# Patient Record
Sex: Male | Born: 1970 | Race: White | Hispanic: No | Marital: Married | Smoking: Never smoker
Health system: Southern US, Community
[De-identification: ages and names within clinical notes are randomized; demographics above are authoritative.]

## PROBLEM LIST (undated history)

## (undated) DIAGNOSIS — M1732 Unilateral post-traumatic osteoarthritis, left knee: Secondary | ICD-10-CM

## (undated) DIAGNOSIS — M6740 Ganglion, unspecified site: Secondary | ICD-10-CM

## (undated) DIAGNOSIS — T8859XA Other complications of anesthesia, initial encounter: Secondary | ICD-10-CM

## (undated) DIAGNOSIS — F909 Attention-deficit hyperactivity disorder, unspecified type: Secondary | ICD-10-CM

## (undated) DIAGNOSIS — E785 Hyperlipidemia, unspecified: Secondary | ICD-10-CM

## (undated) HISTORY — PX: KNEE ARTHROSCOPY: SUR90

## (undated) HISTORY — PX: COLONOSCOPY: SHX174

## (undated) HISTORY — PX: MOHS SURGERY: SHX181

---

## 2012-02-25 DIAGNOSIS — K859 Acute pancreatitis without necrosis or infection, unspecified: Secondary | ICD-10-CM

## 2012-02-25 HISTORY — DX: Acute pancreatitis without necrosis or infection, unspecified: K85.90

## 2019-03-20 ENCOUNTER — Emergency Department
Admission: EM | Admit: 2019-03-20 | Discharge: 2019-03-20 | Disposition: A | Discharge status: Home / Self Care | Payer: BC Managed Care – PPO | Attending: Emergency Medicine | Admitting: Emergency Medicine

## 2019-03-20 ENCOUNTER — Emergency Department: Payer: BC Managed Care – PPO

## 2019-03-20 DIAGNOSIS — R519 Headache, unspecified: Secondary | ICD-10-CM | POA: Insufficient documentation

## 2019-03-20 DIAGNOSIS — R200 Anesthesia of skin: Secondary | ICD-10-CM | POA: Diagnosis present

## 2019-03-20 DIAGNOSIS — R202 Paresthesia of skin: Secondary | ICD-10-CM | POA: Diagnosis not present

## 2019-03-20 LAB — CBC WITH DIFFERENTIAL/PLATELET
Abs Immature Granulocytes: 0.01 10*3/uL (ref 0.00–0.07)
Basophils Absolute: 0 10*3/uL (ref 0.0–0.1)
Basophils Relative: 1 %
Eosinophils Absolute: 0.1 10*3/uL (ref 0.0–0.5)
Eosinophils Relative: 2 %
HCT: 48.5 % (ref 39.0–52.0)
Hemoglobin: 16.8 g/dL (ref 13.0–17.0)
Immature Granulocytes: 0 %
Lymphocytes Relative: 23 %
Lymphs Abs: 1 10*3/uL (ref 0.7–4.0)
MCH: 29.5 pg (ref 26.0–34.0)
MCHC: 34.6 g/dL (ref 30.0–36.0)
MCV: 85.1 fL (ref 80.0–100.0)
Monocytes Absolute: 0.4 10*3/uL (ref 0.1–1.0)
Monocytes Relative: 10 %
Neutro Abs: 2.8 10*3/uL (ref 1.7–7.7)
Neutrophils Relative %: 64 %
Platelets: 137 10*3/uL — ABNORMAL LOW (ref 150–400)
RBC: 5.7 MIL/uL (ref 4.22–5.81)
RDW: 13.1 % (ref 11.5–15.5)
WBC: 4.3 10*3/uL (ref 4.0–10.5)
nRBC: 0 % (ref 0.0–0.2)

## 2019-03-20 LAB — BASIC METABOLIC PANEL
Anion gap: 8 (ref 5–15)
BUN: 16 mg/dL (ref 6–20)
CO2: 28 mmol/L (ref 22–32)
Calcium: 9.5 mg/dL (ref 8.9–10.3)
Chloride: 104 mmol/L (ref 98–111)
Creatinine, Ser: 1.02 mg/dL (ref 0.61–1.24)
GFR calc Af Amer: >60 mL/min (ref >60)
GFR calc non Af Amer: >60 mL/min (ref >60)
Glucose, Bld: 97 mg/dL (ref 70–99)
Potassium: 4.5 mmol/L (ref 3.5–5.1)
Sodium: 140 mmol/L (ref 135–145)

## 2019-03-20 MED ORDER — SODIUM CHLORIDE 0.9 % IV BOLUS
1000.0000 mL | Freq: Once | INTRAVENOUS | Status: AC
  Administered 2019-03-20: 1000 mL via INTRAVENOUS

## 2019-03-20 MED ORDER — METOCLOPRAMIDE HCL 5 MG/ML IJ SOLN
10.0000 mg | Freq: Once | INTRAMUSCULAR | Status: AC
  Administered 2019-03-20: 10 mg via INTRAVENOUS
  Filled 2019-03-20: qty 2

## 2019-03-20 MED ORDER — DIPHENHYDRAMINE HCL 50 MG/ML IJ SOLN
12.5000 mg | Freq: Once | INTRAMUSCULAR | Status: AC
  Administered 2019-03-20: 12.5 mg via INTRAVENOUS
  Filled 2019-03-20: qty 1

## 2019-03-20 MED ORDER — ACETAMINOPHEN 500 MG PO TABS
1000.0000 mg | ORAL_TABLET | Freq: Once | ORAL | Status: AC
  Administered 2019-03-20: 1000 mg via ORAL
  Filled 2019-03-20: qty 2

## 2019-03-20 NOTE — Discharge Instructions (Signed)
Your CT and MRI are negative.  You can follow-up with your primary care doctor.  If you develop facial droop you may need to be started on some steroids for Bell's palsy.  Return to the ER for any other concerns.

## 2019-03-20 NOTE — ED Provider Notes (Signed)
[facility] Emergency Department Provider Note  ____________________________________________   First MD Initiated Contact with Patient 03/20/19 1054     (approximate)  I have reviewed the triage vital signs and the nursing notes.   HISTORY  Chief Complaint Numbness    HPI Jacob Austin is a 49 y.o. male otherwise healthy who comes in for headache and numbness.  Patient states that he has had a 4-day history of headaches.  Was not severe in onset or worst headache of his life.  He states it is just been a mild constant headache for the past few days, originally in the front of his head but kind of wrapping around to the back of his head.  Has been taking over-the-counter's without relief, nothing seems to make it worse.  He states however yesterday he had an episode of right sided face numbness that went away after about 30 minutes.  He went to bed and felt normal around 11 PM however when he woke up around 8 AM he noted to have the numbness on the right cheek and right side of his tongue.  He states that this has been constant since then.  Denies having this previously.  He states that it feels like he has had a procedure on his tooth and the use some numbing medication on it.  He denies any tooth pain.  Denies any difficulty swallowing, weakness, changes in his face, changes in his speech.  He did have an episode of double vision and vertigo 1 week ago that lasted about 30 minutes.  His family member did die from a glioblastoma so he was worried that he could have that.      Medical: None There are no problems to display for this patient.   Prior to Admission medications   Not on File    Allergies Patient has no allergy information on record.  No family history on file.  Social History Denies daily drinking or drugs   Review of Systems Constitutional: No fever/chills Eyes: No visual changes. ENT: No sore throat. Cardiovascular: Denies chest  pain. Respiratory: Denies shortness of breath. Gastrointestinal: No abdominal pain.  No nausea, no vomiting.  No diarrhea.  No constipation. Genitourinary: Negative for dysuria. Musculoskeletal: Negative for back pain. Skin: Negative for rash. Neurological: Positive headache and sensation changes on the right side of his face All other ROS negative ____________________________________________   PHYSICAL EXAM:  VITAL SIGNS: ED Triage Vitals  Enc Vitals Group     BP 03/20/19 1047 (!) 136/91     Pulse Rate 03/20/19 1047 80     Resp 03/20/19 1047 16     Temp 03/20/19 1047 98.8 F (37.1 C)     Temp Source 03/20/19 1047 Oral     SpO2 03/20/19 1047 97 %     Weight 03/20/19 1048 165 lb (74.8 kg)     Height 03/20/19 1048 5\' 5"  (1.651 m)     Head Circumference --      Peak Flow --      Pain Score --      Pain Loc --      Pain Edu? --      Excl. in [place]? --     Constitutional: Alert and oriented. Well appearing and in no acute distress. Eyes: Conjunctivae are normal. EOMI. Head: Atraumatic. Nose: No congestion/rhinnorhea. Mouth/Throat: Mucous membranes are moist.   Neck: No stridor. Trachea Midline. FROM Cardiovascular: Normal rate, regular rhythm. Grossly normal heart sounds.  Good  peripheral circulation. Respiratory: Normal respiratory effort.  No retractions. Lungs CTAB. Gastrointestinal: Soft and nontender. No distention. No abdominal bruits.  Musculoskeletal: No lower extremity tenderness nor edema.  No joint effusions. Neurologic: Cranial nerves II through XII are intact.  Equal strength in arms and legs.  No pronator drift.  Finger-to-nose intact.  Some change in sensation on the right cheek and right side of his tongue.  Maybe some very mild facial asymmetry on the right side. Skin:  Skin is warm, dry and intact. No rash noted. Psychiatric: Mood and affect are normal. Speech and behavior are normal. GU: Deferred   ____________________________________________   LABS (all  labs ordered are listed, but only abnormal results are displayed)  Labs Reviewed  CBC WITH DIFFERENTIAL/PLATELET - Abnormal; Notable for the following components:      Result Value   Platelets 137 (*)    All other components within normal limits  BASIC METABOLIC PANEL   ____________________________________________   ED ECG REPORT I, Vanessa [place], the attending physician, personally viewed and interpreted this ECG.  EKG is normal sinus rate of 74, no st elevation, no T wave inversions, normal intervals ____________________________________________  RADIOLOGY   Official radiology report(s): CT Head Wo Contrast  Result Date: 03/20/2019 CLINICAL DATA:  Sudden onset left-sided facial numbness and tingling since yesterday EXAM: CT HEAD WITHOUT CONTRAST TECHNIQUE: Contiguous axial images were obtained from the base of the skull through the vertex without intravenous contrast. COMPARISON:  None. FINDINGS: Brain: No evidence of acute infarction, hemorrhage, hydrocephalus, extra-axial collection or mass lesion/mass effect. Vascular: No hyperdense vessel or unexpected calcification. Skull: No osseous abnormality. Sinuses/Orbits: Visualized paranasal sinuses are clear. Visualized mastoid sinuses are clear. Visualized orbits demonstrate no focal abnormality. Other: None IMPRESSION: No acute intracranial pathology. Electronically Signed   By: Kathreen Devoid   On: 03/20/2019 12:35   MR BRAIN WO CONTRAST  Result Date: 03/20/2019 CLINICAL DATA:  Neuro deficit, acute, stroke suspected. Additional history provided: Right-sided face numbness. EXAM: MRI HEAD WITHOUT CONTRAST TECHNIQUE: Multiplanar, multiecho pulse sequences of the brain and surrounding structures were obtained without intravenous contrast. COMPARISON:  Head CT 03/20/2019 FINDINGS: Brain: There is no evidence of acute infarct. No evidence of intracranial mass. No midline shift or extra-axial fluid collection. No chronic intracranial blood  products. No focal parenchymal signal abnormality is identified. Cerebral volume is normal for age. Vascular: Flow voids maintained within the proximal large arterial vessels. Skull and upper cervical spine: No focal marrow lesion Sinuses/Orbits: Visualized orbits demonstrate no acute abnormality. Mild mucosal thickening within the bilateral ethmoid and left greater than right maxillary sinuses. Trace fluid within left mastoid air cells. IMPRESSION: Normal MRI appearance of the brain. No evidence of acute intracranial abnormality. Mild paranasal sinus mucosal thickening, greatest within the left maxillary sinus. Electronically Signed   By: Kellie Simmering DO   On: 03/20/2019 14:38    ____________________________________________   PROCEDURES  Procedure(s) performed (including Critical Care):  Procedures   ____________________________________________   INITIAL IMPRESSION / ASSESSMENT AND PLAN / ED COURSE  Radu Misiaszek was evaluated in Emergency Department on 03/20/2019 for the symptoms described in the history of present illness. He was evaluated in the context of the global COVID-19 pandemic, which necessitated consideration that the patient might be at risk for infection with the SARS-CoV-2 virus that causes COVID-19. Institutional protocols and algorithms that pertain to the evaluation of patients at risk for COVID-19 are in a state of rapid change based on information released by regulatory bodies  including the CDC and federal and state organizations. These policies and algorithms were followed during the patient's care in the ED.    Patient presents with some right-sided face tingling and tongue tingling.  NIH stroke scale 1-2.  He has maybe some very mild asymmetry with some flattening on the right side although he states that he has a history of a moles surgery on the left side and he thinks that that could be the cause of the asymmetry.He doesn't notice any chances to face.  He is out of  the window for TPA given last known normal was 1/23 at 11 PM.  Will proceed with CT head to evaluate for intracranial hemorrhage, tumor.  Will consider MRI if CT is negative.  Also treat patient's headache with migraine cocktail to see if it is a complex headache that could be contributing to his symptoms.  Will get labs to evaluate for electrolyte abnormalities.  No obvious facial droop to suggest Bell's palsy.  CT head was negative.  Discussed with patient and will get MRI.  MRI was negative for acute stroke.  Reevaluated patient after the migraine cocktail and his tingling has resolved.  Maybe a little bit on the tongue still but mostly resolved.  We discussed that if he develops facial droop that we could do some steroids for Bell's palsy.  He can follow that up with his primary care doctor.  He does also now report history of trigeminal neuralgia so I stated that if he starts having the shooting electrical pain again that he could follow-up with his primary care doctor for treatment for that as well.  Patient feels medical going home knowing the MRI was negative and will follow up with his primary care doctor  I discussed the provisional nature of ED diagnosis, the treatment so far, the ongoing plan of care, follow up appointments and return precautions with the patient and any family or support people present. They expressed understanding and agreed with the plan, discharged home.   ____________________________________________   FINAL CLINICAL IMPRESSION(S) / ED DIAGNOSES   Final diagnoses:  Tingling      MEDICATIONS GIVEN DURING THIS VISIT:  Medications  acetaminophen (TYLENOL) tablet 1,000 mg (1,000 mg Oral Given 03/20/19 1142)  metoCLOPramide (REGLAN) injection 10 mg (10 mg Intravenous Given 03/20/19 1143)  diphenhydrAMINE (BENADRYL) injection 12.5 mg (12.5 mg Intravenous Given 03/20/19 1143)  sodium chloride 0.9 % bolus 1,000 mL (0 mLs Intravenous Stopped 03/20/19 1245)     ED  Discharge Orders    None       Note:  This document was prepared using Dragon voice recognition software and may include unintentional dictation errors.   Vanessa [place], MD 03/20/19 [phone]

## 2019-03-20 NOTE — ED Triage Notes (Signed)
Pt states he has had a nagging HA for the past 6 days and today woke up with numbness to the right side of face and tongue. Pt is  Ambulatory with a steady gait. Speech is clear.

## 2019-04-12 DIAGNOSIS — C4492 Squamous cell carcinoma of skin, unspecified: Secondary | ICD-10-CM | POA: Insufficient documentation

## 2019-04-12 DIAGNOSIS — E785 Hyperlipidemia, unspecified: Secondary | ICD-10-CM | POA: Insufficient documentation

## 2019-04-12 DIAGNOSIS — G44009 Cluster headache syndrome, unspecified, not intractable: Secondary | ICD-10-CM | POA: Insufficient documentation

## 2021-02-11 DIAGNOSIS — Z969 Presence of functional implant, unspecified: Secondary | ICD-10-CM | POA: Insufficient documentation

## 2021-02-20 ENCOUNTER — Other Ambulatory Visit: Payer: Self-pay | Admitting: Surgery

## 2021-02-21 ENCOUNTER — Other Ambulatory Visit: Payer: Self-pay

## 2021-02-21 ENCOUNTER — Other Ambulatory Visit
Admission: RE | Admit: 2021-02-21 | Discharge: 2021-02-21 | Disposition: A | Discharge status: Home / Self Care | Payer: BC Managed Care – PPO | Source: Ambulatory Visit | Attending: Surgery | Admitting: Surgery

## 2021-02-21 HISTORY — DX: Other complications of anesthesia, initial encounter: T88.59XA

## 2021-02-21 HISTORY — DX: Attention-deficit hyperactivity disorder, unspecified type: F90.9

## 2021-02-21 HISTORY — DX: Ganglion, unspecified site: M67.40

## 2021-02-21 HISTORY — DX: Unilateral post-traumatic osteoarthritis, left knee: M17.32

## 2021-02-21 HISTORY — DX: Hyperlipidemia, unspecified: E78.5

## 2021-02-21 NOTE — Patient Instructions (Addendum)
Your procedure is scheduled on: 02/27/21 - Wednesday Report to the Registration Desk on the 1st floor of the [facility]. To find out your arrival time, please call [phone] between 1PM - 3PM on: 02/26/21 - Tuesday  REMEMBER: Instructions that are not followed completely may result in serious medical risk, up to and including death; or upon the discretion of your surgeon and anesthesiologist your surgery may need to be rescheduled.  Do not eat food after midnight the night before surgery.  No gum chewing, lozengers or hard candies.  You may however, drink CLEAR liquids up to 2 hours before you are scheduled to arrive for your surgery. Do not drink anything within 2 hours of your scheduled arrival time.  Clear liquids include: - water  - apple juice without pulp - gatorade (not RED, PURPLE, OR BLUE) - black coffee or tea (Do NOT add milk or creamers to the coffee or tea) Do NOT drink anything that is not on this list.  TAKE THESE MEDICATIONS THE MORNING OF SURGERY WITH A SIP OF WATER:  - ADDERALL XR 10 MG 24 hr capsule  One week prior to surgery: Stop Anti-inflammatories (NSAIDS) such as Advil, Aleve, Ibuprofen, Motrin, Naproxen, Naprosyn and Aspirin based products such as Excedrin, Goodys Powder, BC Powder.  Stop ANY OVER THE COUNTER supplements until after surgery.  You may however, continue to take Tylenol if needed for pain up until the day of surgery.  No Alcohol for 24 hours before or after surgery.  No Smoking including e-cigarettes for 24 hours prior to surgery.  No chewable tobacco products for at least 6 hours prior to surgery.  No nicotine patches on the day of surgery.  Do not use any "recreational" drugs for at least a week prior to your surgery.  Please be advised that the combination of cocaine and anesthesia may have negative outcomes, up to and including death. If you test positive for cocaine, your surgery will be cancelled.  On the morning of surgery  brush your teeth with toothpaste and water, you may rinse your mouth with mouthwash if you wish. Do not swallow any toothpaste or mouthwash.  Use CHG Soap or wipes as directed on instruction sheet.  Do not wear jewelry, make-up, hairpins, clips or nail polish.  Do not wear lotions, powders, or perfumes.   Do not shave body from the neck down 48 hours prior to surgery just in case you cut yourself which could leave a site for infection.  Also, freshly shaved skin may become irritated if using the CHG soap.  Contact lenses, hearing aids and dentures may not be worn into surgery.  Do not bring valuables to the hospital. [facility] is not responsible for any missing/lost belongings or valuables.   Notify your doctor if there is any change in your medical condition (cold, fever, infection).  Wear comfortable clothing (specific to your surgery type) to the hospital.  After surgery, you can help prevent lung complications by doing breathing exercises.  Take deep breaths and cough every 1-2 hours. Your doctor may order a device called an Incentive Spirometer to help you take deep breaths. When coughing or sneezing, hold a pillow firmly against your incision with both hands. This is called splinting. Doing this helps protect your incision. It also decreases belly discomfort.  If you are being admitted to the hospital overnight, leave your suitcase in the car. After surgery it may be brought to your room.  If you are being discharged the  day of surgery, you will not be allowed to drive home. You will need a responsible adult (18 years or older) to drive you home and stay with you that night.   If you are taking public transportation, you will need to have a responsible adult (18 years or older) with you. Please confirm with your physician that it is acceptable to use public transportation.   Please call the [place] Dept[facility] at [phone] if you have any questions about these  instructions.  Surgery Visitation Policy:  Patients undergoing a surgery or procedure may have one family member or support person with them as long as that person is not COVID-19 positive or experiencing its symptoms.  That person may remain in the waiting area during the procedure and may rotate out with other people.  Inpatient Visitation:    Visiting hours are 7 a.m. to 8 p.m. Up to two visitors ages 16+ are allowed at one time in a patient room. The visitors may rotate out with other people during the day. Visitors must check out when they leave, or other visitors will not be allowed. One designated support person may remain overnight. The visitor must pass COVID-19 screenings, use hand sanitizer when entering and exiting the patients room and wear a mask at all times, including in the patients room. Patients must also wear a mask when staff or their visitor are in the room. Masking is required regardless of vaccination status.

## 2021-02-27 ENCOUNTER — Encounter
Admission: RE | Disposition: A | Discharge status: Home / Self Care | Payer: Self-pay | Source: Ambulatory Visit | Attending: Surgery

## 2021-02-27 ENCOUNTER — Ambulatory Visit: Payer: BC Managed Care – PPO

## 2021-02-27 ENCOUNTER — Ambulatory Visit
Admission: RE | Admit: 2021-02-27 | Discharge: 2021-02-27 | Disposition: A | Discharge status: Home / Self Care | Payer: BC Managed Care – PPO | Source: Ambulatory Visit | Attending: Surgery | Admitting: Surgery

## 2021-02-27 ENCOUNTER — Ambulatory Visit: Payer: BC Managed Care – PPO | Admitting: Anesthesiology

## 2021-02-27 DIAGNOSIS — T8484XA Pain due to internal orthopedic prosthetic devices, implants and grafts, initial encounter: Secondary | ICD-10-CM | POA: Diagnosis not present

## 2021-02-27 DIAGNOSIS — Z09 Encounter for follow-up examination after completed treatment for conditions other than malignant neoplasm: Secondary | ICD-10-CM

## 2021-02-27 DIAGNOSIS — T1490XS Injury, unspecified, sequela: Secondary | ICD-10-CM | POA: Diagnosis not present

## 2021-02-27 DIAGNOSIS — X58XXXS Exposure to other specified factors, sequela: Secondary | ICD-10-CM | POA: Diagnosis not present

## 2021-02-27 DIAGNOSIS — M1732 Unilateral post-traumatic osteoarthritis, left knee: Secondary | ICD-10-CM | POA: Insufficient documentation

## 2021-02-27 HISTORY — PX: HARDWARE REMOVAL: SHX979

## 2021-02-27 SURGERY — REMOVAL, HARDWARE
Anesthesia: General | Site: Knee | Laterality: Left

## 2021-02-27 MED ORDER — 0.9 % SODIUM CHLORIDE (POUR BTL) OPTIME
TOPICAL | Status: DC | PRN
  Administered 2021-02-27: 350 mL

## 2021-02-27 MED ORDER — LIDOCAINE HCL (PF) 2 % IJ SOLN
INTRAMUSCULAR | Status: AC
  Filled 2021-02-27: qty 5

## 2021-02-27 MED ORDER — CHLORHEXIDINE GLUCONATE 0.12 % MT SOLN
OROMUCOSAL | Status: AC
  Administered 2021-02-27: 15 mL via OROMUCOSAL
  Filled 2021-02-27: qty 15

## 2021-02-27 MED ORDER — CHLORHEXIDINE GLUCONATE 0.12 % MT SOLN: 15.0000 mL | Freq: Once | OROMUCOSAL | Status: AC

## 2021-02-27 MED ORDER — BUPIVACAINE-EPINEPHRINE (PF) 0.5% -1:200000 IJ SOLN
INTRAMUSCULAR | Status: AC
  Filled 2021-02-27: qty 30

## 2021-02-27 MED ORDER — MIDAZOLAM HCL 2 MG/2ML IJ SOLN
INTRAMUSCULAR | Status: AC
  Filled 2021-02-27: qty 2

## 2021-02-27 MED ORDER — ONDANSETRON HCL 4 MG/2ML IJ SOLN
INTRAMUSCULAR | Status: DC | PRN
  Administered 2021-02-27: 4 mg via INTRAVENOUS

## 2021-02-27 MED ORDER — BUPIVACAINE HCL (PF) 0.5 % IJ SOLN
INTRAMUSCULAR | Status: DC | PRN
  Administered 2021-02-27: 10 mL

## 2021-02-27 MED ORDER — ORAL CARE MOUTH RINSE: 15.0000 mL | Freq: Once | OROMUCOSAL | Status: AC

## 2021-02-27 MED ORDER — FENTANYL CITRATE (PF) 100 MCG/2ML IJ SOLN
25.0000 ug | INTRAMUSCULAR | Status: DC | PRN
  Administered 2021-02-27 (×3): 25 ug via INTRAVENOUS

## 2021-02-27 MED ORDER — FENTANYL CITRATE (PF) 100 MCG/2ML IJ SOLN
INTRAMUSCULAR | Status: AC
  Filled 2021-02-27: qty 2

## 2021-02-27 MED ORDER — FENTANYL CITRATE (PF) 100 MCG/2ML IJ SOLN
INTRAMUSCULAR | Status: AC
  Administered 2021-02-27: 25 ug via INTRAVENOUS
  Filled 2021-02-27: qty 2

## 2021-02-27 MED ORDER — CEFAZOLIN SODIUM-DEXTROSE 2-4 GM/100ML-% IV SOLN
2.0000 g | INTRAVENOUS | Status: AC
  Administered 2021-02-27: 2 g via INTRAVENOUS

## 2021-02-27 MED ORDER — ACETAMINOPHEN 10 MG/ML IV SOLN
INTRAVENOUS | Status: AC
  Filled 2021-02-27: qty 100

## 2021-02-27 MED ORDER — ONDANSETRON HCL 4 MG/2ML IJ SOLN
INTRAMUSCULAR | Status: AC
  Filled 2021-02-27: qty 2

## 2021-02-27 MED ORDER — ONDANSETRON HCL 4 MG/2ML IJ SOLN: 4.0000 mg | Freq: Once | INTRAMUSCULAR | Status: DC | PRN

## 2021-02-27 MED ORDER — PROPOFOL 10 MG/ML IV BOLUS
INTRAVENOUS | Status: DC | PRN
  Administered 2021-02-27: 150 mg via INTRAVENOUS

## 2021-02-27 MED ORDER — BUPIVACAINE HCL (PF) 0.5 % IJ SOLN
INTRAMUSCULAR | Status: AC
  Filled 2021-02-27: qty 30

## 2021-02-27 MED ORDER — FAMOTIDINE 20 MG PO TABS
ORAL_TABLET | ORAL | Status: AC
  Administered 2021-02-27: 20 mg via ORAL
  Filled 2021-02-27: qty 1

## 2021-02-27 MED ORDER — DEXAMETHASONE SODIUM PHOSPHATE 10 MG/ML IJ SOLN
INTRAMUSCULAR | Status: AC
  Filled 2021-02-27: qty 1

## 2021-02-27 MED ORDER — FAMOTIDINE 20 MG PO TABS: 20.0000 mg | ORAL_TABLET | Freq: Once | ORAL | Status: AC

## 2021-02-27 MED ORDER — CEFAZOLIN SODIUM-DEXTROSE 2-4 GM/100ML-% IV SOLN
INTRAVENOUS | Status: AC
  Filled 2021-02-27: qty 100

## 2021-02-27 MED ORDER — MIDAZOLAM HCL 2 MG/2ML IJ SOLN
INTRAMUSCULAR | Status: DC | PRN
  Administered 2021-02-27: 2 mg via INTRAVENOUS

## 2021-02-27 MED ORDER — DEXAMETHASONE SODIUM PHOSPHATE 10 MG/ML IJ SOLN
INTRAMUSCULAR | Status: DC | PRN
  Administered 2021-02-27: 10 mg via INTRAVENOUS

## 2021-02-27 MED ORDER — LACTATED RINGERS IV SOLN: INTRAVENOUS | Status: DC

## 2021-02-27 MED ORDER — LIDOCAINE HCL (CARDIAC) PF 100 MG/5ML IV SOSY
PREFILLED_SYRINGE | INTRAVENOUS | Status: DC | PRN
Start: 2021-02-27 — End: 2021-02-27
  Administered 2021-02-27: 100 mg via INTRAVENOUS

## 2021-02-27 MED ORDER — FENTANYL CITRATE (PF) 100 MCG/2ML IJ SOLN
INTRAMUSCULAR | Status: DC | PRN
  Administered 2021-02-27 (×2): 50 ug via INTRAVENOUS

## 2021-02-27 SURGICAL SUPPLY — 36 items
APL PRP STRL LF DISP 70% ISPRP (MISCELLANEOUS) ×2
BNDG COHESIVE 4X5 TAN ST LF (GAUZE/BANDAGES/DRESSINGS) ×2 IMPLANT
BNDG ELASTIC 4X5.8 VLCR STR LF (GAUZE/BANDAGES/DRESSINGS) ×2 IMPLANT
BNDG ESMARK 4X12 TAN STRL LF (GAUZE/BANDAGES/DRESSINGS) ×2 IMPLANT
CHLORAPREP W/TINT 26 (MISCELLANEOUS) ×4 IMPLANT
CUFF TOURN SGL QUICK 34 (TOURNIQUET CUFF) ×2
CUFF TRNQT CYL 34X4X40X1 (TOURNIQUET CUFF) IMPLANT
DRAPE FLUOR MINI C-ARM 54X84 (DRAPES) ×2 IMPLANT
DRAPE INCISE IOBAN 66X45 STRL (DRAPES) ×2 IMPLANT
DRAPE U-SHAPE 47X51 STRL (DRAPES) ×2 IMPLANT
ELECT CAUTERY BLADE 6.4 (BLADE) ×2 IMPLANT
ELECT REM PT RETURN 9FT ADLT (ELECTROSURGICAL) ×2
ELECTRODE REM PT RTRN 9FT ADLT (ELECTROSURGICAL) ×1 IMPLANT
GAUZE 4X4 16PLY ~~LOC~~+RFID DBL (SPONGE) ×2 IMPLANT
GAUZE SPONGE 4X4 12PLY STRL (GAUZE/BANDAGES/DRESSINGS) ×2 IMPLANT
GAUZE XEROFORM 1X8 LF (GAUZE/BANDAGES/DRESSINGS) ×2 IMPLANT
GLOVE SURG ENC MOIS LTX SZ8 (GLOVE) ×4 IMPLANT
GLOVE SURG UNDER LTX SZ8 (GLOVE) ×2 IMPLANT
GOWN STRL REUS W/ TWL LRG LVL3 (GOWN DISPOSABLE) ×2 IMPLANT
GOWN STRL REUS W/ TWL XL LVL3 (GOWN DISPOSABLE) ×1 IMPLANT
GOWN STRL REUS W/TWL LRG LVL3 (GOWN DISPOSABLE) ×4
GOWN STRL REUS W/TWL XL LVL3 (GOWN DISPOSABLE) ×2
KIT TURNOVER KIT A (KITS) ×2 IMPLANT
MANIFOLD NEPTUNE II (INSTRUMENTS) ×2 IMPLANT
NDL FILTER BLUNT 18X1 1/2 (NEEDLE) ×1 IMPLANT
NEEDLE FILTER BLUNT 18X 1/2SAF (NEEDLE) ×1
NEEDLE FILTER BLUNT 18X1 1/2 (NEEDLE) ×1 IMPLANT
NS IRRIG 1000ML POUR BTL (IV SOLUTION) ×2 IMPLANT
PACK EXTREMITY ARMC (MISCELLANEOUS) ×2 IMPLANT
STAPLER SKIN PROX 35W (STAPLE) ×2 IMPLANT
STOCKINETTE M/LG 89821 (MISCELLANEOUS) ×2 IMPLANT
SUT PROLENE 4 0 PS 2 18 (SUTURE) ×4 IMPLANT
SUT VIC AB 2-0 SH 27 (SUTURE) ×4
SUT VIC AB 2-0 SH 27XBRD (SUTURE) ×2 IMPLANT
SYR 10ML LL (SYRINGE) ×2 IMPLANT
WATER STERILE IRR 500ML POUR (IV SOLUTION) ×2 IMPLANT

## 2021-02-27 NOTE — Transfer of Care (Signed)
Immediate Anesthesia Transfer of Care Note  Patient: Jacob Austin  Procedure(s) Performed: HARDWARE REMOVAL (Left: Knee)  Patient Location: PACU  Anesthesia Type:General  Level of Consciousness: sedated  Airway & Oxygen Therapy: Patient Spontanous Breathing and Patient connected to face mask oxygen  Post-op Assessment: Report given to RN and Post -op Vital signs reviewed and stable  Post vital signs: Reviewed and stable  Last Vitals:  Vitals Value Taken Time  BP 132/90 02/27/21 1400  Temp 36 C 02/27/21 1359  Pulse 68 02/27/21 1402  Resp 13 02/27/21 1402  SpO2 100 % 02/27/21 1402  Vitals shown include unvalidated device data.  Last Pain:  Vitals:   02/27/21 1046  TempSrc: Temporal  PainSc: 3          Complications: No notable events documented.

## 2021-02-27 NOTE — H&P (Signed)
History of Present Illness: Jacob Austin is a 51 y.o. male who presents today for repeat evaluation of ongoing left knee pain. The patient was initially evaluated in September of this year, at this appointment he was diagnosed with painful retained surgical hardware pertaining to his left knee and posttraumatic osteoarthritic changes. The patient is status post a left knee ACL reconstruction according to the patient in addition to 2 additional left knee arthroscopy procedures. The patient did undergo x-rays of the left knee which did demonstrate evidence of posttraumatic osteoarthritic changes. The patient does have a screw present to the distal femur indicative of probable LCL reconstruction. The patient did have moderate calcific changes to the proximal aspect of the LCL. The patient was offered and received a left knee steroid injection, he states that the injection did provide significant leaf of his discomfort for 1 month however recently he reports increased pain when laying down or with routine activities. He still is performing most if not all of his activities at this time, he takes Aleve as needed for discomfort. Pain score is a 3 out of 10 to the left upper extremity. The patient denies any pain across the medial or anterior aspect knee at today's visit, all of his pain is located along the lateral aspect along the distal femur. He denies any numbness or tingling to the left upper extremity. He denies any personal history of heart attack, stroke, asthma or COPD. No personal history of blood clots.  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, left (Under left eye)   Thrombocytopenia (CMS-HCC) - Chronicand benign per hematol.)   Trigeminal neuralgia   Past Surgical History:  MOHS SURGERY 2020 (Under left eye)   ARTHROSCOPIC REPAIR ACL Bilateral   KNEE  ARTHROSCOPY 1988   Past Family History:  Brain cancer Mother   Prostate cancer Father   Atrial fibrillation Father   No Known Problems Brother   No Known Problems Brother   Medications:  cetirizine (ZYRTEC) 10 MG tablet Take 1 tablet (10 mg total) by mouth once daily   cholecalciferol (VITAMIN D3) 1000 unit tablet Take by mouth   cyanocobalamin, vitamin B-12, 50 mcg tablet Take 1 tablet (50 mcg total) by mouth once daily   dextroamphetamine-amphetamine (ADDERALL XR) 10 MG XR capsule Take 1 capsule (10 mg total) by mouth once daily for 30 days 30 capsule 0   ibuprofen (MOTRIN) 200 MG tablet Take 4 tablets (800 mg total) by mouth as needed for Pain   dextroamphetamine-amphetamine (ADDERALL XR) 10 MG XR capsule Take 1 capsule (10 mg total) by mouth every morning for 30 days 30 capsule 0   dextroamphetamine-amphetamine (ADDERALL XR) 10 MG XR capsule Take 1 capsule (10 mg total) by mouth once daily for 30 days 30 capsule 0   dextroamphetamine-amphetamine (ADDERALL XR) 10 MG XR capsule Take 1 capsule (10 mg total) by mouth once daily for 30 days 30 capsule 0   diclofenac (VOLTAREN) 1 % topical gel Apply 2 g topically 2 (two) times daily (Patient not taking: Reported on 02/11/2021) 100 g 1   Allergies:  Codeine Nausea And Vomiting   Penicillins Hives   Sulfa (Sulfonamide Antibiotics) Hives   Review of Systems:  A comprehensive 14 point ROS was performed, reviewed by me today, and the pertinent orthopaedic findings are documented in the HPI.  Physical  Exam: BP 136/84   Ht 165.1 cm (5\' 5" )   Wt 74 kg (163 lb 3.2 oz)   BMI 27.16 kg/m  General/Constitutional: The patient appears to be well-nourished, well-developed, and in no acute distress. Neuro/Psych: Normal mood and affect, oriented to person, place and time. Eyes: Non-icteric. Pupils are equal, round, and reactive to light, and exhibit synchronous movement. ENT: Unremarkable. Lymphatic: No palpable adenopathy. Respiratory: Lungs clear  to auscultation, Normal chest excursion, No wheezes and Non-labored breathing Cardiovascular: Regular rate and rhythm. No murmurs. and No edema, swelling or tenderness, except as noted in detailed exam. Integumentary: No impressive skin lesions present, except as noted in detailed exam. Musculoskeletal: Unremarkable, except as noted in detailed exam.  General: Well developed, well nourished 51 y.o. male in no apparent distress. Normal affect. Normal communication. Patient answers questions appropriately. The patient has a normal gait. There is no antalgic component. There is no hip lurch.   Left lower Extremity: Examination of the left lower extremity reveals no bony abnormality, there is no effusion. Previous surgical incisions are present and are well-healed. Lateral knee incision in addition to knee arthroscopy incisions are identified to left knee.. There is no valgus or varus abnormality. Nontender palpation along the medial or lateral joint line at today's visit. He does report moderate tenderness with palpation of the lateral femoral condyle in the area of the screw that can be visualized on x-ray. The patient has full left knee extension has excellent knee flexion without significant discomfort today. Negative McMurray's test to the left knee there is no retropatellar discomfort. The patient has a negative patella stretch test. The patient has a negative varus stress test and a negative valgus stress test, in looking for stability. He does not have any pain with gentle varus or valgus stress testing at today's visit. The patient has a negative Lachman's test, stable Lachman's when compared to right knee as well.  Vascular: The patient has a negative Bevelyn Buckles' test bilaterally. The patient had a normal dorsalis pedis and posterior tibial pulse. There is normal skin warmth. There is normal capillary refill bilaterally.   Neurologic: The patient has a negative straight leg raise. The patient has  normal muscle strength testing for the quadriceps, calves, ankle dorsiflexion, ankle plantarflexion, and extensor hallicus longus. The patient has sensation that is intact to light touch. The deep tendon reflexes are normal at the patella and achilles. No clonus is noted.   Imaging: AP, lateral and sunrise views of the left knee were obtained previously in the office and reviewed today. These x-rays demonstrate that the patient is status post both a right knee ACL reconstruction in addition to a questionable ACL reconstruction according to the patient versus LCL reconstruction with screw across the distal femur. Moderate calcifications surrounding the area of the proximal insertion of the LCL tendon. The patient also has moderate calcific changes to both the medial and lateral meniscus. Joint space appears to be relatively well-maintained, patellofemoral space is intact. No acute fracture is identified no lytic lesions identified.  Impression: 1. Post-traumatic osteoarthritis of left knee. 2. Retained orthopedic hardware.  Plan:  1. Treatment options were discussed today with the patient. 2. The patient is not experiencing any pain along the medial or lateral joint line of the knee, all of his pain is located directly over the screw which can be palpated along lateral aspect the knee 3. The patient is quite frustrated by his continued discomfort and would like to consider more aggressive treatment option. 4.  The risk and benefits of a surgical procedure involving removal of painful orthopedic hardware were discussed in detail today. The patient will proceed with surgery with Dr. Roland Rack in the future. 5. This document will serve as a surgical history and physical for the patient. 6. The patient will follow-up per standard postop protocol. They can call the clinic they have any questions, new symptoms develop or symptoms worsen.  The procedure was discussed with the patient, as were the potential risks  (including bleeding, infection, nerve and/or blood vessel injury, persistent or recurrent pain, failure of the repair, progression of arthritis, need for further surgery, blood clots, strokes, heart attacks and/or arhythmias, pneumonia, etc.) and benefits. The patient states his understanding and wishes to proceed.   H&P reviewed and patient re-examined. No changes.

## 2021-02-27 NOTE — Anesthesia Preprocedure Evaluation (Signed)
Anesthesia Evaluation  Patient identified by MRN, date of birth, ID band Patient awake    Reviewed: Allergy & Precautions, H&P , NPO status , Patient's Chart, lab work & pertinent test results, reviewed documented beta blocker date and time   History of Anesthesia Complications (+) history of anesthetic complications  Airway Mallampati: II  TM Distance: >3 FB Neck ROM: full    Dental  (+) Teeth Intact   Pulmonary neg pulmonary ROS,    Pulmonary exam normal        Cardiovascular Exercise Tolerance: Good negative cardio ROS Normal cardiovascular exam Rate:Normal     Neuro/Psych PSYCHIATRIC DISORDERS negative neurological ROS     GI/Hepatic negative GI ROS, Neg liver ROS,   Endo/Other  negative endocrine ROS  Renal/GU negative Renal ROS  negative genitourinary   Musculoskeletal   Abdominal   Peds  Hematology negative hematology ROS (+)   Anesthesia Other Findings   Reproductive/Obstetrics negative OB ROS                             Anesthesia Physical Anesthesia Plan  ASA: 2  Anesthesia Plan: General LMA   Post-op Pain Management:    Induction:   PONV Risk Score and Plan:   Airway Management Planned:   Additional Equipment:   Intra-op Plan:   Post-operative Plan:   Informed Consent: I have reviewed the patients History and Physical, chart, labs and discussed the procedure including the risks, benefits and alternatives for the proposed anesthesia with the patient or authorized representative who has indicated his/her understanding and acceptance.       Plan Discussed with: CRNA  Anesthesia Plan Comments:         Anesthesia Quick Evaluation

## 2021-02-27 NOTE — Op Note (Signed)
02/27/2021  2:07 PM  Patient:   Jacob Austin  Pre-Op Diagnosis:   Painful retained screw, left knee.  Post-Op Diagnosis:   Same  Procedure:   Removal of the painful retained screw, left knee.  Surgeon:   Pascal Lux, MD  Assistant:   Rocco Pauls, PA-S  Anesthesia:   General LMA  Findings:   As above.  Complications:   None  Fluids:   400 cc crystalloid  EBL:   0 cc  UOP:   None  TT:   42 minutes at 300 mmHg  Drains:   None  Closure:   Staples  Brief Clinical Note:   The patient is a 51 year old male who is now many years status post an open lateral collateral ligament repair and two subsequent arthroscopic procedures who presented 5 months ago with increasing lateral sided left knee pain.  His symptoms have persisted despite medications, activity modification, etc. his history and examination consistent with a painful retained surgical screw on the lateral aspect of his knee.  He presents at this time for removal of the painful retained surgical hardware.  Procedure:   The patient was brought into the operating room and lain in the supine position.  After adequate general laryngeal mask anesthesia was obtained, the patient's left lower extremity was prepped with ChloraPrep solution before being draped sterilely.  Preoperative antibiotics were administered.  A timeout was performed to verify the appropriate surgical site before the limb was exsanguinated with an Esmarch and the tourniquet inflated to 300 mmHg.    Utilizing the previous incision, an approximately 5 cm incision was made centered over the palpable retained screw head.  The incision was carried down through the subcutaneous tissue to expose the IT band.  Utilizing FluoroScan imaging, the exact location of the head was identified.  A short stab incision was made in line with the fibers of the IT band and a moderate amount of heterotopic bone was removed overlying the screw head.  The screw head was then visualized  and removed using the appropriate screwdriver.  In addition, the soft tissue washer was identified and removed as well.  The wound was copiously irrigated with sterile saline solution using bulb irrigation before the IT band was reapproximated using 2-0 Vicryl interrupted sutures.  The subcutaneous tissues also were closed in 2 layers using 2-0 Vicryl interrupted sutures before the skin was closed using staples.  A total of 10 cc of 0.5% Sensorcaine with epinephrine was injected in and around the incision site to help with postoperative analgesia before a sterile bulky dressing was applied to the knee.  The patient was then awakened, extubated, and returned to the recovery room in satisfactory condition after tolerating the procedure well.

## 2021-02-27 NOTE — OR Nursing (Signed)
Dr. Roland Rack explanted 2" screw and soft tissue washer from patient's left knee.   Fuller Mandril, RN

## 2021-02-27 NOTE — Anesthesia Procedure Notes (Signed)
Procedure Name: LMA Insertion Date/Time: 02/27/2021 12:41 PM Performed by: Aline Brochure, CRNA Pre-anesthesia Checklist: Patient identified, Patient being monitored, Timeout performed, Emergency Drugs available and Suction available Patient Re-evaluated:Patient Re-evaluated prior to induction Oxygen Delivery Method: Circle system utilized Preoxygenation: Pre-oxygenation with 100% oxygen Induction Type: IV induction Ventilation: Mask ventilation without difficulty LMA: LMA inserted LMA Size: 4.0 Tube type: Oral Number of attempts: 1 Placement Confirmation: positive ETCO2 and breath sounds checked- equal and bilateral Tube secured with: Tape Dental Injury: Teeth and Oropharynx as per pre-operative assessment

## 2021-02-27 NOTE — Discharge Instructions (Addendum)
Orthopedic discharge instructions: Keep dressing dry and intact.  May shower after dressing changed on post-op day #4 (Saturday).  Cover staples/sutures with Band-Aids after drying off. Apply ice frequently to knee. Take Aleve 2 tabs BID OR ibuprofen 600-800 mg TID with meals for 5-7 days, then as necessary. Take pain medication as prescribed or ES Tylenol when needed.  May weight-bear as tolerated - use crutches or walker as needed. Follow-up in 10-14 days or as scheduled.   AMBULATORY SURGERY  DISCHARGE INSTRUCTIONS   The drugs that you were given will stay in your system until tomorrow so for the next 24 hours you should not:  Drive an automobile Make any legal decisions Drink any alcoholic beverage   You may resume regular meals tomorrow.  Today it is better to start with liquids and gradually work up to solid foods.  You may eat anything you prefer, but it is better to start with liquids, then soup and crackers, and gradually work up to solid foods.   Please notify your doctor immediately if you have any unusual bleeding, trouble breathing, redness and pain at the surgery site, drainage, fever, or pain not relieved by medication.    Additional Instructions:        Please contact your physician with any problems or Same Day Surgery at [phone], Monday through Friday 6 am to 4 pm, or [place] [facility] number at [phone].

## 2021-02-28 ENCOUNTER — Encounter: Payer: Self-pay | Admitting: Surgery

## 2021-03-01 ENCOUNTER — Other Ambulatory Visit: Payer: Self-pay | Admitting: Surgery

## 2021-03-01 ENCOUNTER — Ambulatory Visit
Admission: RE | Admit: 2021-03-01 | Discharge: 2021-03-01 | Disposition: A | Discharge status: Home / Self Care | Payer: BC Managed Care – PPO | Source: Ambulatory Visit | Attending: Surgery | Admitting: Surgery

## 2021-03-01 ENCOUNTER — Other Ambulatory Visit: Payer: Self-pay

## 2021-03-01 DIAGNOSIS — Z969 Presence of functional implant, unspecified: Secondary | ICD-10-CM | POA: Insufficient documentation

## 2021-03-01 DIAGNOSIS — M7989 Other specified soft tissue disorders: Secondary | ICD-10-CM

## 2021-03-06 NOTE — Anesthesia Postprocedure Evaluation (Signed)
Anesthesia Post Note  Patient: Jacob Austin  Procedure(s) Performed: HARDWARE REMOVAL (Left: Knee)  Patient location during evaluation: PACU Anesthesia Type: General Level of consciousness: awake and alert Pain management: pain level controlled Vital Signs Assessment: post-procedure vital signs reviewed and stable Respiratory status: spontaneous breathing, nonlabored ventilation, respiratory function stable and patient connected to nasal cannula oxygen Cardiovascular status: blood pressure returned to baseline and stable Postop Assessment: no apparent nausea or vomiting Anesthetic complications: no   No notable events documented.   Last Vitals:  Vitals:   02/27/21 1505 02/27/21 1517  BP:  119/85  Pulse: 67 83  Resp: 15 16  Temp:  (!) 36.2 C  SpO2: 93% 100%    Last Pain:  Vitals:   02/27/21 1517  TempSrc: Temporal  PainSc: Taylor Fanchon [place]

## 2021-03-11 ENCOUNTER — Other Ambulatory Visit (HOSPITAL_COMMUNITY): Payer: Self-pay | Admitting: Surgery

## 2021-03-11 ENCOUNTER — Ambulatory Visit
Admission: RE | Admit: 2021-03-11 | Discharge: 2021-03-11 | Disposition: A | Discharge status: Home / Self Care | Payer: BC Managed Care – PPO | Source: Ambulatory Visit | Attending: Surgery | Admitting: Surgery

## 2021-03-11 ENCOUNTER — Other Ambulatory Visit: Payer: Self-pay | Admitting: Surgery

## 2021-03-11 DIAGNOSIS — M7989 Other specified soft tissue disorders: Secondary | ICD-10-CM

## 2021-03-11 DIAGNOSIS — Z969 Presence of functional implant, unspecified: Secondary | ICD-10-CM | POA: Diagnosis present

## 2021-03-19 ENCOUNTER — Encounter (INDEPENDENT_AMBULATORY_CARE_PROVIDER_SITE_OTHER): Payer: Self-pay | Admitting: Vascular Surgery

## 2021-03-19 ENCOUNTER — Other Ambulatory Visit: Payer: Self-pay

## 2021-03-19 ENCOUNTER — Ambulatory Visit (INDEPENDENT_AMBULATORY_CARE_PROVIDER_SITE_OTHER): Payer: BC Managed Care – PPO | Admitting: Vascular Surgery

## 2021-03-19 VITALS — BP 114/83 | HR 78 | Resp 16 | Ht 65.5 in | Wt 158.0 lb

## 2021-03-19 DIAGNOSIS — F909 Attention-deficit hyperactivity disorder, unspecified type: Secondary | ICD-10-CM

## 2021-03-19 DIAGNOSIS — I82442 Acute embolism and thrombosis of left tibial vein: Secondary | ICD-10-CM

## 2021-03-19 DIAGNOSIS — E785 Hyperlipidemia, unspecified: Secondary | ICD-10-CM

## 2021-03-19 NOTE — Progress Notes (Signed)
Patient ID: Jacob Austin, male   DOB: 07-Apr-1970, 51 y.o.   MRN: [id number]  Chief Complaint  Patient presents with   New Patient (Initial Visit)    Ref Mikle Bosworth acute left calf dvt    HPI Jacob Austin is a 51 y.o. male.  I am asked to see the patient by Cameron Proud for evaluation of a left lower extremity DVT.  The patient underwent hardware removal on the left leg on January 4.  That leg became more painful and swollen than would be expected following this procedure.  He had initial DVT study that was negative but a subsequent DVT study showed acute tibial vein DVT.  He was appropriately started on Xarelto due to his significant symptoms of pain and swelling and within 3 to 4 days his swelling and pain subsided substantially.  Currently they are minimal.  He has been on anticoagulation for about a week now.  No previous history of DVT or superficial thrombophlebitis to his knowledge.  He had a father who had TIAs and a grandfather who had stroke but no known pulmonary embolus or DVT.  Has tolerated anticoagulation without signs of bleeding or other problems.     Past Medical History:  Diagnosis Date   ADHD (attention deficit hyperactivity disorder)    Complication of anesthesia    was given to much anesthesia and took a couple days to wake up   Ganglion cyst    HLD (hyperlipidemia)    Pancreatitis 2014   Post-traumatic osteoarthritis of left knee     Past Surgical History:  Procedure Laterality Date   COLONOSCOPY     HARDWARE REMOVAL Left 02/27/2021   Procedure: HARDWARE REMOVAL;  Surgeon: Corky Mull, MD;  Location: [facility] ORS;  Service: Orthopedics;  Laterality: Left;   KNEE ARTHROSCOPY Bilateral    ACL repair   MOHS SURGERY Left    eye lid     Family History  Problem Relation Age of Onset   Brain cancer Mother    Thyroid disease Mother    Transient ischemic attack Father    Atrial fibrillation Father    Prostate cancer Paternal Uncle    Bladder Cancer Paternal Grandfather     Stroke Paternal Grandfather    Diabetes Paternal Grandfather       Social History   Tobacco Use   Smoking status: Never   Smokeless tobacco: Never  Substance Use Topics   Alcohol use: Yes    Comment: daily drink   Drug use: Never     Allergies  Allergen Reactions   Codeine Nausea And Vomiting   Penicillins Hives   Sulfa Antibiotics Hives    Current Outpatient Medications  Medication Sig Dispense Refill   ADDERALL XR 10 MG 24 hr capsule Take 10 mg by mouth in the morning.     cetirizine (ZYRTEC) 10 MG tablet Take 10 mg by mouth as needed.     ibuprofen (ADVIL) 200 MG tablet Take 400-800 mg by mouth every 8 (eight) hours as needed (for pain.).     XARELTO 15 MG TABS tablet Take 15 mg by mouth 2 (two) times daily.     No current facility-administered medications for this visit.      REVIEW OF SYSTEMS (Negative unless checked)  Constitutional: [] Weight loss  [] Fever  [] Chills Cardiac: [] Chest pain   [] Chest pressure   [] Palpitations   [] Shortness of breath when laying flat   [] Shortness of breath at rest   [] Shortness of  breath with exertion. Vascular:  [] Pain in legs with walking   [x] Pain in legs at rest   [] Pain in legs when laying flat   [] Claudication   [] Pain in feet when walking  [] Pain in feet at rest  [] Pain in feet when laying flat   [] History of DVT   [x] Phlebitis   [x] Swelling in legs   [] Varicose veins   [] Non-healing ulcers Pulmonary:   [] Uses home oxygen   [] Productive cough   [] Hemoptysis   [] Wheeze  [] COPD   [] Asthma Neurologic:  [] Dizziness  [] Blackouts   [] Seizures   [] History of stroke   [] History of TIA  [] Aphasia   [] Temporary blindness   [] Dysphagia   [] Weakness or numbness in arms   [] Weakness or numbness in legs Musculoskeletal:  [] Arthritis   [] Joint swelling   [] Joint pain   [] Low back pain Hematologic:  [] Easy bruising  [] Easy bleeding   [] Hypercoagulable state   [] Anemic  [] Hepatitis Gastrointestinal:  [] Blood in stool   [] Vomiting blood   [] Gastroesophageal reflux/heartburn   [] Abdominal pain Genitourinary:  [] Chronic kidney disease   [] Difficult urination  [] Frequent urination  [] Burning with urination   [] Hematuria Skin:  [] Rashes   [] Ulcers   [] Wounds Psychological:  [] History of anxiety   []  History of major depression.    Physical Exam BP 114/83 (BP Location: Left Arm)    Pulse 78    Resp 16    Ht 5' 5.5" (1.664 m)    Wt 158 lb (71.7 kg)    BMI 25.89 kg/m  Gen:  WD/WN, NAD Head: [place]/AT, No temporalis wasting.  Ear/Nose/Throat: Hearing grossly intact, nares w/o erythema or drainage, oropharynx w/o Erythema/Exudate Eyes: Conjunctiva clear, sclera non-icteric  Neck: trachea midline.  No JVD.  Pulmonary:  Good air movement, respirations not labored, no use of accessory muscles  Cardiac: RRR, no JVD Vascular:  Vessel Right Left  Radial Palpable Palpable                          DP Palpable Palpable  PT Palpable Palpable   Gastrointestinal:. No masses, surgical incisions, or scars. Musculoskeletal: M/S 5/5 throughout.  Extremities without ischemic changes.  No deformity or atrophy.  Trace left lower extremity edema. Neurologic: Sensation grossly intact in extremities.  Symmetrical.  Speech is fluent. Motor exam as listed above. Psychiatric: Judgment intact, Mood & affect appropriate for pt's clinical situation. Dermatologic: No rashes or ulcers noted.  No cellulitis or open wounds.    Radiology US Venous Img Lower Unilateral Left (DVT)  Result Date: 03/11/2021 CLINICAL DATA:  LEFT lower extremity swelling. EXAM: LEFT LOWER EXTREMITY VENOUS DOPPLER ULTRASOUND TECHNIQUE: Gray-scale sonography with compression, as well as color and duplex ultrasound, were performed to evaluate the deep venous system(s) from the level of the common femoral vein through the popliteal and proximal calf veins. COMPARISON:  LEFT lower extremity venous duplex, 03/01/2021. FINDINGS: VENOUS Normal compressibility of the LEFT common femoral,  superficial femoral, and popliteal veins. Visualized portions of profunda femoral vein and great saphenous vein unremarkable. Echogenic filling defects with lack of compressibility involving the imaged portions of the LEFT posterior tibial and peroneal veins. See key images Limited views of the contralateral common femoral vein are unremarkable. OTHER No evidence of superficial thrombophlebitis or abnormal fluid collection. Limitations: none IMPRESSION: Acute, occlusive DVT within the LEFT PTV and peroneal veins. These results will be called to the ordering clinician or representative by the Radiologist Assistant, and communication documented in the  PACS or [employer]. Michaelle Birks, MD [facility] Radiology Electronically Signed   By: Michaelle Birks M.D.   On: 03/11/2021 17:05   US Venous Img Lower Unilateral Left (DVT)  Result Date: 03/01/2021 CLINICAL DATA:  LEFT lower extremity swelling. History of orthopedic procedure. EXAM: LEFT LOWER EXTREMITY VENOUS DOPPLER ULTRASOUND TECHNIQUE: Gray-scale sonography with compression, as well as color and duplex ultrasound, were performed to evaluate the deep venous system(s) from the level of the common femoral vein through the popliteal and proximal calf veins. COMPARISON:  None. FINDINGS: VENOUS Normal compressibility of the LEFT common femoral, superficial femoral, and popliteal veins, as well as the visualized calf veins. Visualized portions of profunda femoral vein and great saphenous vein unremarkable. No filling defects to suggest DVT on grayscale or color Doppler imaging. Doppler waveforms show normal direction of venous flow, normal respiratory plasticity and response to augmentation. Limited views of the contralateral common femoral vein are unremarkable. OTHER No evidence of superficial thrombophlebitis or abnormal fluid collection. Limitations: none IMPRESSION: No evidence of femoropopliteal DVT within the  LEFT lower extremity. Michaelle Birks, MD [facility] Radiology Electronically Signed   By: Michaelle Birks M.D.   On: 03/01/2021 12:11   DG MINI C-ARM IMAGE ONLY  Result Date: 02/27/2021 There is no interpretation for this exam.  This order is for images obtained during a surgical procedure.  Please See "Surgeries" Tab for more information regarding the procedure.    Labs No results found for this or any previous visit (from the past 2160 hour(s)).  Assessment/Plan:  DVT (deep venous thrombosis) ([place]) The patient has a left tibial vein DVT.  This was significantly symptomatic so he was appropriately started on anticoagulation which has markedly reduced his symptoms.  At this point, I would do 3 months of anticoagulation for a provoked tibial vein DVT and then we can see him in the office with or without an ultrasound to reassess.  Following that, on aspirin daily would likely be appropriate.  I discussed the pathophysiology and natural history of DVT.  I discussed these calf vein DVTs are the lowest risk DVT but often cause a lot of symptoms.  Hyperlipidemia lipid control important in reducing the progression of atherosclerotic disease.  Diet controlled   Attention deficit hyperactivity disorder (ADHD) On Adderall and doing well      Leotis Pain 03/20/2021, 8:29 AM   This note was created with Dragon medical transcription system.  Any errors from dictation are unintentional.

## 2021-03-20 DIAGNOSIS — I82409 Acute embolism and thrombosis of unspecified deep veins of unspecified lower extremity: Secondary | ICD-10-CM | POA: Insufficient documentation

## 2021-03-20 DIAGNOSIS — F909 Attention-deficit hyperactivity disorder, unspecified type: Secondary | ICD-10-CM | POA: Insufficient documentation

## 2021-03-20 NOTE — Patient Instructions (Signed)
Deep Vein Thrombosis Deep vein thrombosis (DVT) is a condition in which a blood clot forms in a vein of the deep venous system. This can occur in the lower leg, thigh, pelvis, arm, or neck. A clot is blood that has thickened into a gel or solid. This condition is serious and can be life-threatening if the clot travels to the arteries of the lungs and causes a blockage (pulmonary embolism). A DVT can also damage veins in the leg, which can lead to long-term venous disease, leg pain, swelling, discoloration, and ulcers or sores (post-thrombotic syndrome). What are the causes? This condition may be caused by: A slowdown of blood flow. Damage to a vein. A condition that causes blood to clot more easily, such as certain bleeding disorders. What increases the risk? The following factors may make you more likely to develop this condition: Obesity. Being older, especially older than age 26. Being inactive or not moving around (sedentary lifestyle). This may include: Sitting or lying down for longer than 4-6 hours other than to sleep at night. Being in the hospital, or having major or lengthy surgery. Having any recent bone injuries, such as breaks (fractures), that reduce movement, especially in the lower extremities. Having recent orthopedic surgery on the lower extremities. Being pregnant, giving birth, or having recently given birth. Taking medicines that contain estrogen, such as birth control or hormone replacement therapy. Using products that contain nicotine or tobacco, especially if you use hormonal birth control. Having a history of a blood vessel disease (peripheral vascular disease) or congestive heart disease. Having a history of cancer, especially if being treated with chemotherapy. What are the signs or symptoms? Symptoms of this condition include: Swelling, pain, pressure, or tenderness in an arm or a leg. An arm or a leg becoming warm, red, or discolored. A leg turning very pale or  blue. You may have a large DVT. This is rare. If the clot is in your leg, you may notice that symptoms get worse when you stand or walk. In some cases, there are no symptoms. How is this diagnosed? This condition is diagnosed with: Your medical history and a physical exam. Tests, such as: Blood tests to check how well your blood clots. Doppler ultrasound. This is the best way to find a DVT. CT venogram. Contrast dye is injected into a vein, and X-rays are taken to check for clots. This is helpful for veins in the chest or pelvis. How is this treated? Treatment for this condition depends on: The cause of your DVT. The size and location of your DVT, or having more than one DVT. Your risk for bleeding or developing more clots. Other medical conditions you may have. Treatment may include: Taking a blood thinner medicine (anticoagulant) to prevent more clots from forming or current clots from growing. Wearing compression stockings. Injecting medicines into the affected vein to break up the clot (catheter-directed thrombolysis). Surgical procedures, when DVT is severe or hard to treat. These may be done to: Isolate and remove your clot. Place an inferior vena cava (IVC) filter. This filter is placed into a large vein called the inferior vena cava to catch blood clots before they reach your lungs. You may get some medical treatments for 6 months or longer. Follow these instructions at home: If you are taking blood thinners: Talk with your health care provider before you take any medicines that contain aspirin or NSAIDs, such as ibuprofen. These medicines increase your risk for dangerous bleeding. Take your medicine exactly as  told, at the same time every day. Do not skip a dose. Do not take more than the prescribed dose. This is important. Ask your health care provider about foods and medicines that could change or interact with the way your blood thinner works. Avoid these foods and medicines  if you are told to do so. Avoid anything that may cause bleeding or bruising. You may bleed more easily while taking blood thinners. Be very careful when using knives, scissors, or other sharp objects. Use an electric razor instead of a blade. Avoid activities that could cause injury or bruising, and follow instructions for preventing falls. Tell your health care provider if you have had any internal bleeding, bleeding ulcers, or neurologic diseases, such as strokes or cerebral aneurysms. Wear a medical alert bracelet or carry a card that lists what medicines you take. General instructions Take over-the-counter and prescription medicines only as told by your health care provider. Return to your normal activities as told by your health care provider. Ask your health care provider what activities are safe for you. If recommended, wear compression stockings as told by your health care provider. These stockings help to prevent blood clots and reduce swelling in your legs. Never wear your compression stockings while sleeping at night. Keep all follow-up visits. This is important. Where to find more information American Heart Association: www.heart.org Centers for Disease Control and Prevention: http://www.wolf.info/ National Heart, Lung, and Blood Institute: https://wilson-eaton.com/ Contact a health care provider if: You miss a dose of your blood thinner. You have unusual bruising or other color changes. You have new or worse pain, swelling, or redness in an arm or a leg. You have worsening numbness or tingling in an arm or a leg. You have a significant color change (pale or blue) in the extremity that has the DVT. Get help right away if: You have signs or symptoms that a blood clot has moved to the lungs. These may include: Shortness of breath. Chest pain. Fast or irregular heartbeats (palpitations). Light-headedness, dizziness, or fainting. Coughing up blood. You have signs or symptoms that your blood is  too thin. These may include: Blood in your vomit, stool, or urine. A cut that will not stop bleeding. A menstrual period that is heavier than usual. A severe headache or confusion. These symptoms may be an emergency. Get help right away. Call 911. Do not wait to see if the symptoms will go away. Do not drive yourself to the hospital. Summary Deep vein thrombosis (DVT) happens when a blood clot forms in a deep vein. This may occur in the lower leg, thigh, pelvis, arm, or neck. Symptoms affect the arm or leg and can include swelling, pain, tenderness, warmth, redness, or discoloration. This condition may be treated with medicines. In severe cases, a procedure or surgery may be done to remove or dissolve the clots. If you are taking blood thinners, take them exactly as told. Do not skip a dose. Do not take more than is prescribed. Get help right away if you have a severe headache, shortness of breath, chest pain, fast or irregular heartbeats, or blood in your vomit, urine, or stool. This information is not intended to replace advice given to you by your health care provider. Make sure you discuss any questions you have with your health care provider. Document Revised: 09/03/2020 Document Reviewed: 09/03/2020 Elsevier Patient Education  [place].

## 2021-03-20 NOTE — Assessment & Plan Note (Signed)
On Adderall and doing well

## 2021-03-20 NOTE — Assessment & Plan Note (Signed)
The patient has a left tibial vein DVT.  This was significantly symptomatic so he was appropriately started on anticoagulation which has markedly reduced his symptoms.  At this point, I would do 3 months of anticoagulation for a provoked tibial vein DVT and then we can see him in the office with or without an ultrasound to reassess.  Following that, on aspirin daily would likely be appropriate.  I discussed the pathophysiology and natural history of DVT.  I discussed these calf vein DVTs are the lowest risk DVT but often cause a lot of symptoms.

## 2021-03-20 NOTE — Assessment & Plan Note (Signed)
lipid control important in reducing the progression of atherosclerotic disease. Diet controlled 

## 2021-06-18 ENCOUNTER — Ambulatory Visit (INDEPENDENT_AMBULATORY_CARE_PROVIDER_SITE_OTHER): Payer: BC Managed Care – PPO

## 2021-06-18 ENCOUNTER — Ambulatory Visit (INDEPENDENT_AMBULATORY_CARE_PROVIDER_SITE_OTHER): Payer: BC Managed Care – PPO | Admitting: Vascular Surgery

## 2021-06-18 ENCOUNTER — Encounter (INDEPENDENT_AMBULATORY_CARE_PROVIDER_SITE_OTHER): Payer: Self-pay | Admitting: Vascular Surgery

## 2021-06-18 VITALS — BP 115/76 | HR 85 | Resp 16 | Ht 65.0 in | Wt 157.8 lb

## 2021-06-18 DIAGNOSIS — I82442 Acute embolism and thrombosis of left tibial vein: Secondary | ICD-10-CM

## 2021-06-18 DIAGNOSIS — E785 Hyperlipidemia, unspecified: Secondary | ICD-10-CM | POA: Diagnosis not present

## 2021-06-18 DIAGNOSIS — F909 Attention-deficit hyperactivity disorder, unspecified type: Secondary | ICD-10-CM | POA: Diagnosis not present

## 2021-06-18 NOTE — Progress Notes (Signed)
? ? ?MRN : [id number] ? ?Jacob Austin is a 51 y.o. (12-16-1970) male who presents with chief complaint of No chief complaint on file. ?. ? ?History of Present Illness: Patient returns today in follow up of his tibial vein DVT.  He was highly symptomatic and has received 3 months of anticoagulation which she has tolerated well.  Within a few weeks of starting the anticoagulation, he had resolution of the pain and swelling in his leg.  This was a provoked DVT after an orthopedic surgery on that leg.  He has had no chest pain or shortness of breath. Duplex today showed resolution of the previous DVT with no DVT or superficial thrombophlebitis identified.  ? ?Current Outpatient Medications  ?Medication Sig Dispense Refill  ? ADDERALL XR 10 MG 24 hr capsule Take 10 mg by mouth in the morning.    ? cetirizine (ZYRTEC) 10 MG tablet Take 10 mg by mouth as needed.    ? ibuprofen (ADVIL) 200 MG tablet Take 400-800 mg by mouth every 8 (eight) hours as needed (for pain.).    ? XARELTO 15 MG TABS tablet Take 15 mg by mouth 2 (two) times daily.    ? ?No current facility-administered medications for this visit.  ? ? ?Past Medical History:  ?Diagnosis Date  ? ADHD (attention deficit hyperactivity disorder)   ? Complication of anesthesia   ? was given to much anesthesia and took a couple days to wake up  ? Ganglion cyst   ? HLD (hyperlipidemia)   ? Pancreatitis 2014  ? Post-traumatic osteoarthritis of left knee   ? ? ?Past Surgical History:  ?Procedure Laterality Date  ? COLONOSCOPY    ? HARDWARE REMOVAL Left 02/27/2021  ? Procedure: HARDWARE REMOVAL;  Surgeon: Corky Mull, MD;  Location: [facility];  Service: Orthopedics;  Laterality: Left;  ? KNEE ARTHROSCOPY Bilateral   ? ACL repair  ? MOHS SURGERY Left   ? eye lid  ? ? ? ?Social History  ? ?Tobacco Use  ? Smoking status: Never  ? Smokeless tobacco: Never  ?Substance Use Topics  ? Alcohol use: Yes  ?  Comment: daily drink  ? Drug use: Never  ? ? ? ? ?Family History  ?Problem Relation  Age of Onset  ? Brain cancer Mother   ? Thyroid disease Mother   ? Transient ischemic attack Father   ? Atrial fibrillation Father   ? Prostate cancer Paternal Uncle   ? Bladder Cancer Paternal Grandfather   ? Stroke Paternal Grandfather   ? Diabetes Paternal Grandfather   ? ? ? ?Allergies  ?Allergen Reactions  ? Codeine Nausea And Vomiting  ? Penicillins Hives  ? Sulfa Antibiotics Hives  ? ? ?REVIEW OF SYSTEMS (Negative unless checked) ?  ?Constitutional: '[]'$ Weight loss  '[]'$ Fever  '[]'$ Chills ?Cardiac: '[]'$ Chest pain   '[]'$ Chest pressure   '[]'$ Palpitations   '[]'$ Shortness of breath when laying flat   '[]'$ Shortness of breath at rest   '[]'$ Shortness of breath with exertion. ?Vascular:  '[]'$ Pain in legs with walking   '[x]'$ Pain in legs at rest   '[]'$ Pain in legs when laying flat   '[]'$ Claudication   '[]'$ Pain in feet when walking  '[]'$ Pain in feet at rest  '[]'$ Pain in feet when laying flat   '[]'$ History of DVT   '[x]'$ Phlebitis   '[x]'$ Swelling in legs   '[]'$ Varicose veins   '[]'$ Non-healing ulcers ?Pulmonary:   '[]'$ Uses home oxygen   '[]'$ Productive cough   '[]'$ Hemoptysis   '[]'$ Wheeze  '[]'$ COPD   '[]'$ Asthma ?  Neurologic:  '[]'$ Dizziness  '[]'$ Blackouts   '[]'$ Seizures   '[]'$ History of stroke   '[]'$ History of TIA  '[]'$ Aphasia   '[]'$ Temporary blindness   '[]'$ Dysphagia   '[]'$ Weakness or numbness in arms   '[]'$ Weakness or numbness in legs ?Musculoskeletal:  '[]'$ Arthritis   '[]'$ Joint swelling   '[]'$ Joint pain   '[]'$ Low back pain ?Hematologic:  '[]'$ Easy bruising  '[]'$ Easy bleeding   '[]'$ Hypercoagulable state   '[]'$ Anemic  '[]'$ Hepatitis ?Gastrointestinal:  '[]'$ Blood in stool   '[]'$ Vomiting blood  '[]'$ Gastroesophageal reflux/heartburn   '[]'$ Abdominal pain ?Genitourinary:  '[]'$ Chronic kidney disease   '[]'$ Difficult urination  '[]'$ Frequent urination  '[]'$ Burning with urination   '[]'$ Hematuria ?Skin:  '[]'$ Rashes   '[]'$ Ulcers   '[]'$ Wounds ?Psychological:  '[]'$ History of anxiety   '[]'$  History of major depression. ?  ? ?Physical Examination ? ?BP 115/76 (BP Location: Right Arm)   Pulse 85   Resp 16   Ht '5\' 5"'$  (1.651 m)   Wt 157 lb 12.8 oz (71.6 kg)    BMI 26.26 kg/m?  ?Gen:  WD/WN, NAD ?Head: [place]/AT, No temporalis wasting. ?Ear/Nose/Throat: Hearing grossly intact, nares w/o erythema or drainage ?Eyes: Conjunctiva clear. Sclera non-icteric ?Neck: Supple.  Trachea midline ?Pulmonary:  Good air movement, no use of accessory muscles.  ?Cardiac: RRR, no JVD ?Vascular:  ?Vessel Right Left  ?Radial Palpable Palpable  ?    ?    ?    ?    ?    ?    ?PT Palpable Palpable  ?DP Palpable Palpable  ? ? ?Musculoskeletal: M/S 5/5 throughout.  No deformity or atrophy. No edema. ?Neurologic: Sensation grossly intact in extremities.  Symmetrical.  Speech is fluent.  ?Psychiatric: Judgment intact, Mood & affect appropriate for pt's clinical situation. ?Dermatologic: No rashes or ulcers noted.  No cellulitis or open wounds. ? ? ? ? ? ?Labs ?No results found for this or any previous visit (from the past 2160 hour(s)). ? ?Radiology ?VAS [place] LOWER EXTREMITY VENOUS (DVT) ? ?Result Date: 06/18/2021 ? Lower Venous DVT Study Patient Name:  Jacob Austin  Date of Exam:   06/18/2021 Medical Rec #: [id number]    Accession #:    [id number] Date of Birth: 1970-11-26     Patient Gender: M Patient Age:   14 years Exam Location:  [facility] Procedure:      VAS [place] LOWER EXTREMITY VENOUS (DVT) Referring Phys: Leotis Pain --------------------------------------------------------------------------------  Indications: Pain.  Risk Factors: DVT left calf. Performing Technologist: Concha Norway RVT  Examination Guidelines: A complete evaluation includes B-mode imaging, spectral Doppler, color Doppler, and power Doppler as needed of all accessible portions of each vessel. Bilateral testing is considered an integral part of a complete examination. Limited examinations for reoccurring indications may be performed as noted. The reflux portion of the exam is performed with the patient in reverse Trendelenburg.  +---------+---------------+---------+-----------+----------+--------------+ LEFT      CompressibilityPhasicitySpontaneityPropertiesThrombus Aging +---------+---------------+---------+-----------+----------+--------------+ CFV      Full           Yes      Yes                                 +---------+---------------+---------+-----------+----------+--------------+ SFJ      Full           Yes      Yes                                 +---------+---------------+---------+-----------+----------+--------------+  FV Prox  Full           Yes      Yes                                 +---------+---------------+---------+-----------+----------+--------------+ FV Mid   Full           Yes      Yes                                 +---------+---------------+---------+-----------+----------+--------------+ FV DistalFull           Yes      Yes                                 +---------+---------------+---------+-----------+----------+--------------+ PFV      Full           Yes      Yes                                 +---------+---------------+---------+-----------+----------+--------------+ POP      Full           Yes      Yes                                 +---------+---------------+---------+-----------+----------+--------------+ PTV      Full           Yes      Yes                                 +---------+---------------+---------+-----------+----------+--------------+ PERO     Full           Yes      Yes                                 +---------+---------------+---------+-----------+----------+--------------+ Gastroc  Full           Yes      Yes                                 +---------+---------------+---------+-----------+----------+--------------+ GSV      Full           Yes      Yes                                 +---------+---------------+---------+-----------+----------+--------------+ SSV      Full                                                         +---------+---------------+---------+-----------+----------+--------------+     Summary: LEFT: - No evidence of deep vein thrombosis in the lower extremity. No indirect evidence of obstruction proximal to the inguinal ligament.  *See table(s) above for measurements and observations. Electronically signed by  Leotis Pain MD on 06/18/2021 at 3:10:08 PM.    Final    ? ?Ass

## 2021-06-18 NOTE — Assessment & Plan Note (Signed)
Duplex today showed resolution of the previous DVT with no DVT or superficial thrombophlebitis identified. We can stop anticoagulation at this point. RTC PRN. ?

## 2021-12-23 ENCOUNTER — Encounter (INDEPENDENT_AMBULATORY_CARE_PROVIDER_SITE_OTHER): Payer: Self-pay

## 2022-01-13 LAB — COLOGUARD: COLOGUARD: NEGATIVE

## 2022-08-15 ENCOUNTER — Other Ambulatory Visit: Payer: Self-pay

## 2022-08-15 DIAGNOSIS — I82442 Acute embolism and thrombosis of left tibial vein: Secondary | ICD-10-CM

## 2022-08-15 DIAGNOSIS — Z0000 Encounter for general adult medical examination without abnormal findings: Secondary | ICD-10-CM

## 2022-08-15 DIAGNOSIS — E785 Hyperlipidemia, unspecified: Secondary | ICD-10-CM

## 2022-08-20 ENCOUNTER — Ambulatory Visit
Admission: RE | Admit: 2022-08-20 | Discharge: 2022-08-20 | Disposition: A | Discharge status: Home / Self Care | Payer: BC Managed Care – PPO | Source: Ambulatory Visit | Attending: Infectious Diseases | Admitting: Infectious Diseases

## 2022-08-20 DIAGNOSIS — E785 Hyperlipidemia, unspecified: Secondary | ICD-10-CM | POA: Insufficient documentation

## 2022-08-20 DIAGNOSIS — Z0000 Encounter for general adult medical examination without abnormal findings: Secondary | ICD-10-CM | POA: Insufficient documentation

## 2022-08-20 DIAGNOSIS — I82442 Acute embolism and thrombosis of left tibial vein: Secondary | ICD-10-CM | POA: Insufficient documentation

## 2023-08-06 IMAGING — US US EXTREM LOW VENOUS*L*
1 series · 14 of 24 positions shown · non-contrast
Comparison: LEFT lower extremity venous duplex, 03/01/2021.

CLINICAL DATA: LEFT lower extremity swelling.

EXAM:
LEFT LOWER EXTREMITY VENOUS DOPPLER ULTRASOUND
TECHNIQUE: Gray-scale sonography with compression, as well as color and duplex
ultrasound, were performed to evaluate the deep venous system(s)
from the level of the common femoral vein through the popliteal and
proximal calf veins.

[Series 1: us venous img lower uni left (dvt) · portal-venous · 14 of 33 slices shown]
[im 1/33]
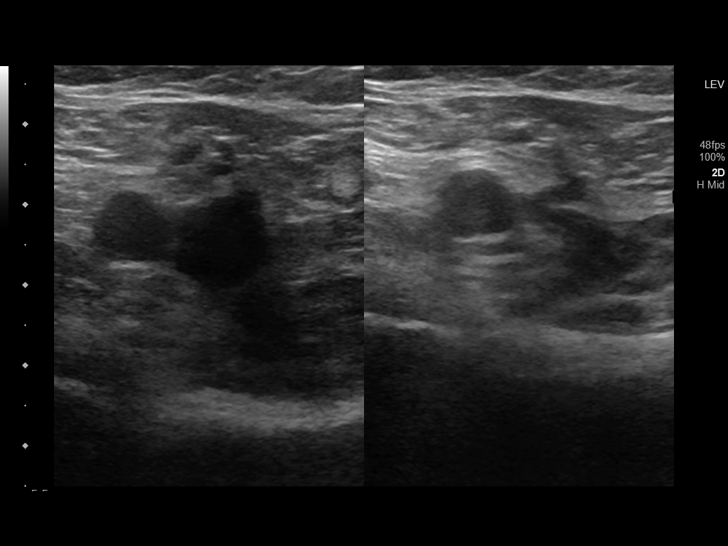
[im 3/33]
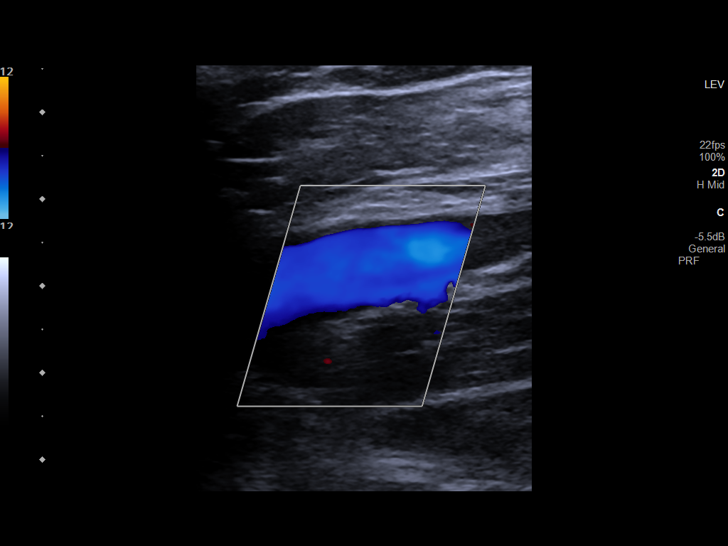
[im 6/33]
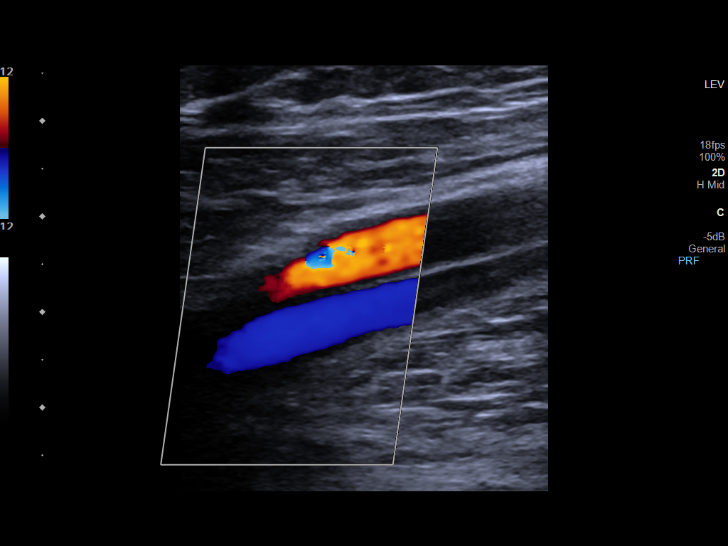
[im 9/33]
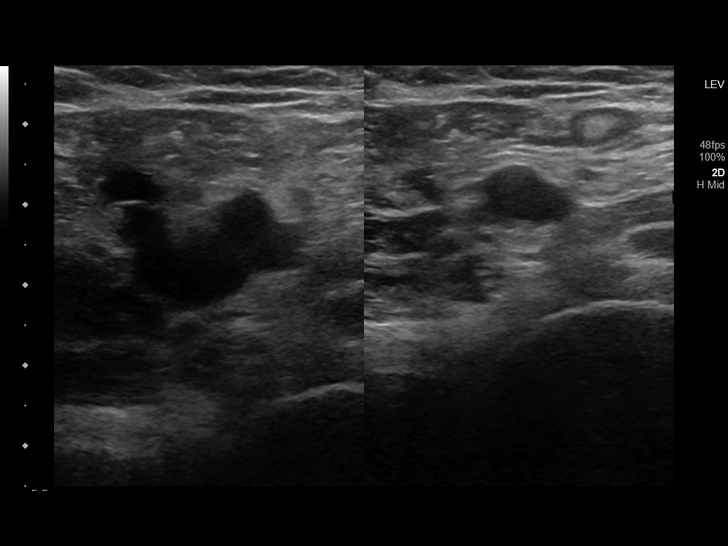
[im 10/33]
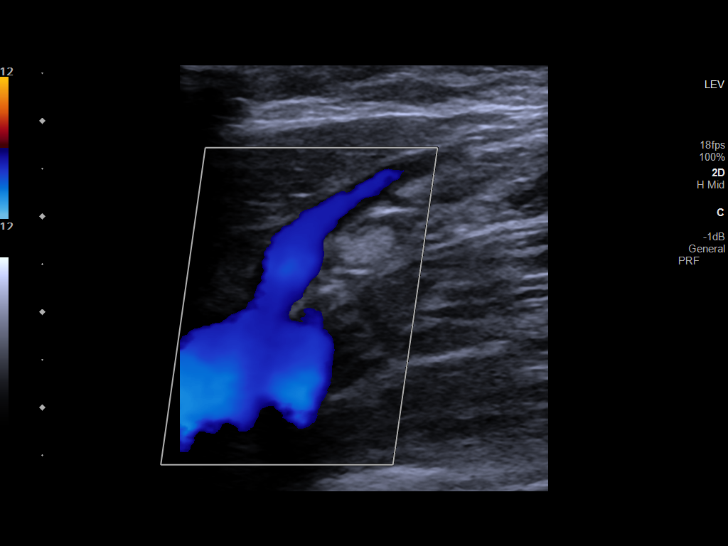
[im 13/33]
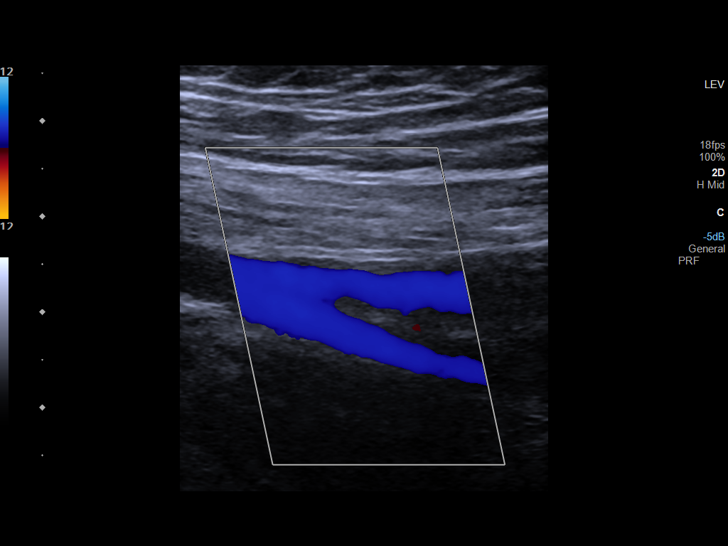
[im 16/33]
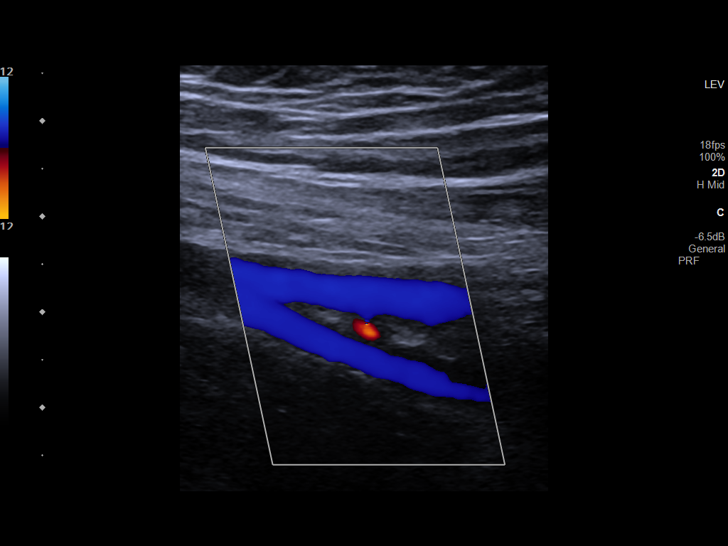
[im 17/33]
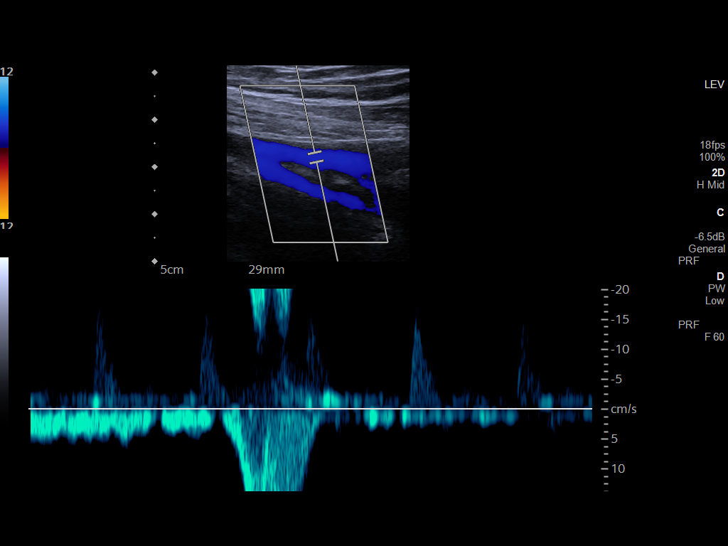
[im 20/33]
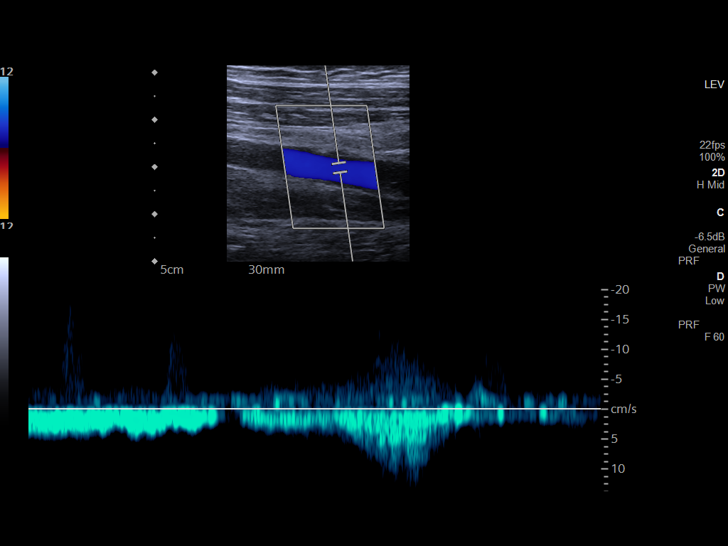
[im 23/33]
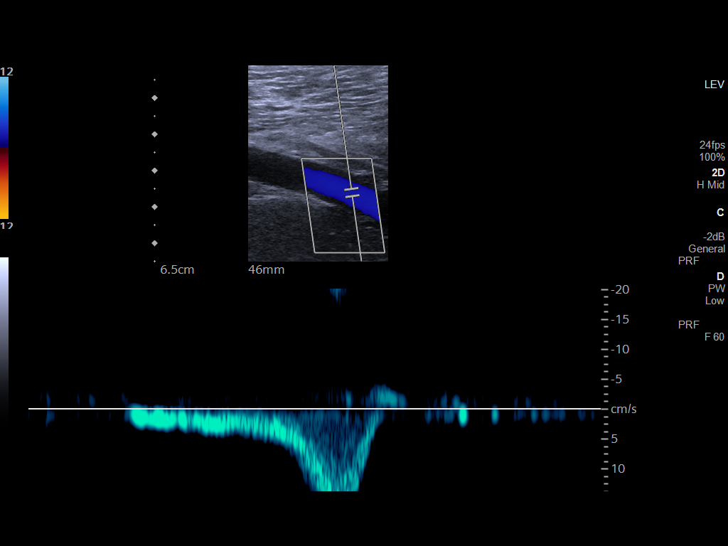
[im 26/33]
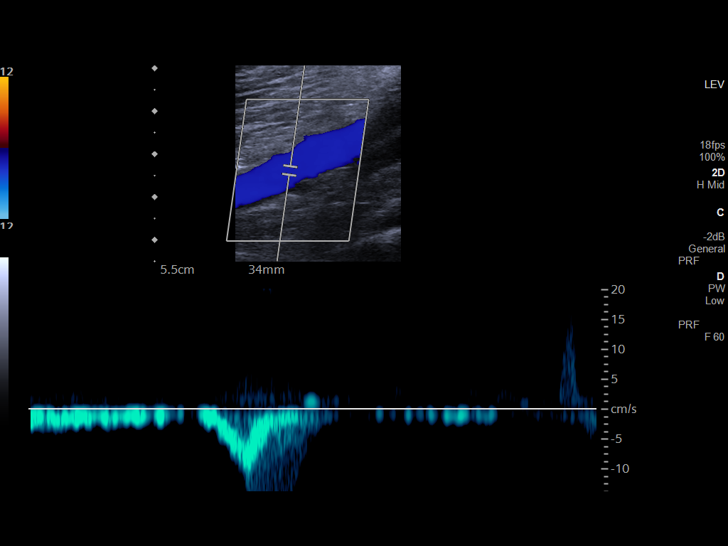
[im 27/33]
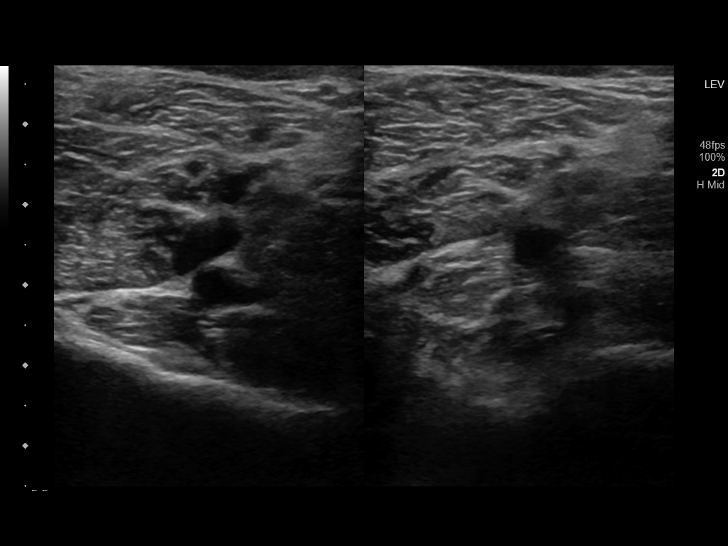
[im 30/33]
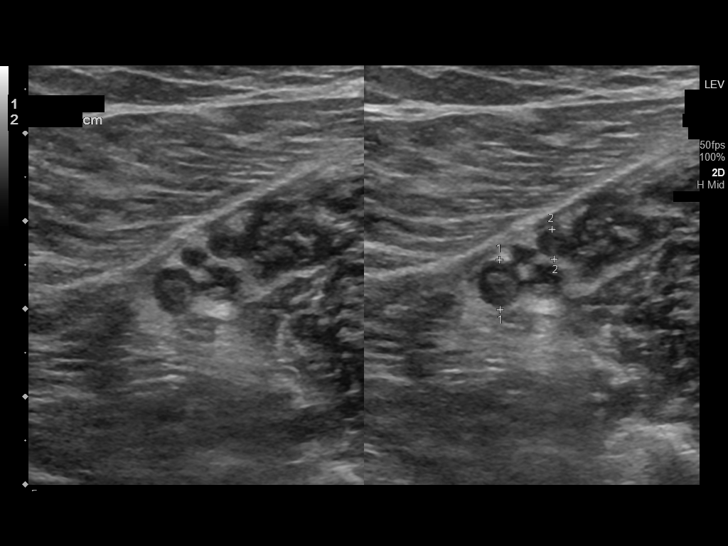
[im 33/33]
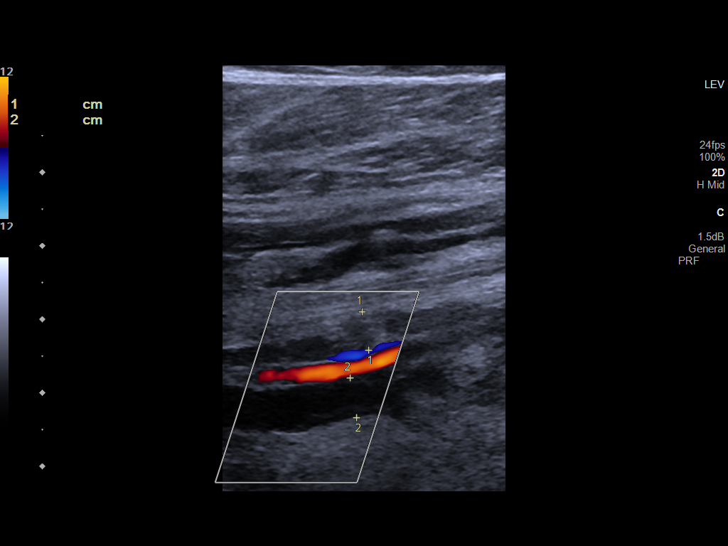

[14 of 24 positions shown; findings below may reference images not displayed]

FINDINGS: VENOUS

Normal compressibility of the LEFT common femoral, superficial
femoral, and popliteal veins. Visualized portions of profunda
femoral vein and great saphenous vein unremarkable.

Echogenic filling defects with lack of compressibility involving the
imaged portions of the LEFT posterior tibial and peroneal veins. See
key images

Limited views of the contralateral common femoral vein are
unremarkable.

OTHER

No evidence of superficial thrombophlebitis or abnormal fluid
collection.

Limitations: none
IMPRESSION: Acute, occlusive DVT within the LEFT PTV and peroneal veins.

These results will be called to the ordering clinician or
representative by the Radiologist Assistant, and communication
documented in the PACS or [REDACTED].

## 2024-03-11 ENCOUNTER — Emergency Department

## 2024-03-11 ENCOUNTER — Emergency Department: Admission: EM | Admit: 2024-03-11 | Discharge: 2024-03-11 | Disposition: A | Discharge status: Home / Self Care

## 2024-03-11 DIAGNOSIS — K209 Esophagitis, unspecified without bleeding: Secondary | ICD-10-CM | POA: Insufficient documentation

## 2024-03-11 DIAGNOSIS — R1010 Upper abdominal pain, unspecified: Secondary | ICD-10-CM | POA: Diagnosis present

## 2024-03-11 LAB — BASIC METABOLIC PANEL WITH GFR
Anion gap: 9 (ref 5–15)
BUN: 16 mg/dL (ref 6–20)
CO2: 25 mmol/L (ref 22–32)
Calcium: 9.6 mg/dL (ref 8.9–10.3)
Chloride: 104 mmol/L (ref 98–111)
Creatinine, Ser: 0.98 mg/dL (ref 0.61–1.24)
GFR, Estimated: >60 mL/min
Glucose, Bld: 106 mg/dL — ABNORMAL HIGH (ref 70–99)
Potassium: 4.3 mmol/L (ref 3.5–5.1)
Sodium: 138 mmol/L (ref 135–145)

## 2024-03-11 LAB — CBC
HCT: 50.2 % (ref 39.0–52.0)
Hemoglobin: 17.3 g/dL — ABNORMAL HIGH (ref 13.0–17.0)
MCH: 30.4 pg (ref 26.0–34.0)
MCHC: 34.5 g/dL (ref 30.0–36.0)
MCV: 88.1 fL (ref 80.0–100.0)
Platelets: 162 K/uL (ref 150–400)
RBC: 5.7 MIL/uL (ref 4.22–5.81)
RDW: 12.3 % (ref 11.5–15.5)
WBC: 8 K/uL (ref 4.0–10.5)
nRBC: 0 % (ref 0.0–0.2)

## 2024-03-11 LAB — URINALYSIS, ROUTINE W REFLEX MICROSCOPIC
Bilirubin Urine: NEGATIVE
Glucose, UA: NEGATIVE mg/dL
Hgb urine dipstick: NEGATIVE
Ketones, ur: NEGATIVE mg/dL
Leukocytes,Ua: NEGATIVE
Nitrite: NEGATIVE
Protein, ur: NEGATIVE mg/dL
Specific Gravity, Urine: 1.021 (ref 1.005–1.030)
pH: 6 (ref 5.0–8.0)

## 2024-03-11 MED ORDER — FENTANYL CITRATE (PF) 50 MCG/ML IJ SOSY: 50.0000 ug | PREFILLED_SYRINGE | Freq: Once | INTRAMUSCULAR | Status: DC

## 2024-03-11 MED ORDER — ONDANSETRON HCL 4 MG/2ML IJ SOLN
4.0000 mg | Freq: Once | INTRAMUSCULAR | Status: AC
  Administered 2024-03-11: 4 mg via INTRAVENOUS
  Filled 2024-03-11: qty 2

## 2024-03-11 MED ORDER — SODIUM CHLORIDE 0.9 % IV BOLUS
1000.0000 mL | Freq: Once | INTRAVENOUS | Status: AC
  Administered 2024-03-11: 1000 mL via INTRAVENOUS

## 2024-03-11 MED ORDER — PANTOPRAZOLE SODIUM 40 MG PO TBEC: 40.0000 mg | DELAYED_RELEASE_TABLET | Freq: Every day | ORAL | 1 refills | Status: AC

## 2024-03-11 MED ORDER — PANTOPRAZOLE SODIUM 40 MG IV SOLR
40.0000 mg | Freq: Once | INTRAVENOUS | Status: AC
  Administered 2024-03-11: 40 mg via INTRAVENOUS
  Filled 2024-03-11: qty 10

## 2024-03-11 NOTE — ED Triage Notes (Signed)
 First nurse note: Pt to ED via POV from [facility]. Pt reports RLQ pain with nausea that started today at 7am and is getting worse. Hx of kidney stones.

## 2024-03-11 NOTE — ED Triage Notes (Signed)
 Pt. In via pov from home for right side abdominal pain that started a few hours ago, states it radiates towards the back and described as sharp pain, denies urinary symptoms, no N/V/D

## 2024-03-11 NOTE — ED Provider Triage Note (Signed)
 Emergency Medicine Provider Triage Evaluation Note  Jacob Austin , a 54 y.o. male  was evaluated in triage.  Pt complains of abdominal pain right side and radiates to the back. Began this afternoon.  Review of Systems  Positive: Abdominal pain, nausea,  Negative: Vomiting, fever, urinary symptoms, diarrhea, constipation  Physical Exam  BP (!) 125/94   Pulse 87   Temp 97.9 F (36.6 C) (Oral)   Resp 17   Ht 5' 5 (1.651 m)   Wt 75.3 kg   SpO2 100%   BMI 27.62 kg/m  Gen:   Awake, no distress   Resp:  Normal effort  MSK:   Moves extremities without difficulty  Other:    Medical Decision Making  Medically screening exam initiated at 3:49 PM.  Appropriate orders placed.  Jacob Austin was informed that the remainder of the evaluation will be completed by another provider, this initial triage assessment does not replace that evaluation, and the importance of remaining in the ED until their evaluation is complete.     Jacob Tinnie LABOR, PA-C 03/11/24 1551

## 2024-03-11 NOTE — ED Provider Notes (Signed)
 "  [facility] Provider Note    Event Date/Time   First MD Initiated Contact with Patient 03/11/24 1820     (approximate)   History   Abdominal Pain   HPI  Jacob Austin is a 54 y.o. male history of pancreatitis, since emergency department complaining of abdominal pain that started this morning.  States he got up and took some medication and ate some toast.  On the way to work his right upper quadrant and epigastric area began hurting.  States now it is more right lower quadrant pain with some nausea.  Feels that it is getting worse.  Denies fever, chills.  No vomiting or diarrhea.  States it is a burning type pain that is coming up into the center of the chest like reflux but does not feel like he has actual GERD      Physical Exam   Triage Vital Signs: ED Triage Vitals  Encounter Vitals Group     BP 03/11/24 1545 (!) 125/94     Girls Systolic BP Percentile --      Girls Diastolic BP Percentile --      Boys Systolic BP Percentile --      Boys Diastolic BP Percentile --      Pulse Rate 03/11/24 1545 87     Resp 03/11/24 1545 17     Temp 03/11/24 1545 97.9 F (36.6 C)     Temp Source 03/11/24 1545 Oral     SpO2 03/11/24 1545 100 %     Weight 03/11/24 1541 168 lb (76.2 kg)     Height 03/11/24 1541 5' 5 (1.651 m)     Head Circumference --      Peak Flow --      Pain Score 03/11/24 1545 5     Pain Loc --      Pain Education --      Exclude from Growth Chart --     Most recent vital signs: Vitals:   03/11/24 1545 03/11/24 2008  BP: (!) 125/94 129/88  Pulse: 87 88  Resp: 17 18  Temp: 97.9 F (36.6 C) 97.8 F (36.6 C)  SpO2: 100% 100%     General: Awake, no distress.   CV:  Good peripheral perfusion. regular rate and  rhythm Resp:  Normal effort. Lungs CTA Abd:  No distention.  Tender along the right upper quadrant Other:      ED Results / Procedures / Treatments   Labs (all labs ordered are listed, but only abnormal results are  displayed) Labs Reviewed  CBC - Abnormal; Notable for the following components:      Result Value   Hemoglobin 17.3 (*)    All other components within normal limits  BASIC METABOLIC PANEL WITH GFR - Abnormal; Notable for the following components:   Glucose, Bld 106 (*)    All other components within normal limits  URINALYSIS, ROUTINE W REFLEX MICROSCOPIC - Abnormal; Notable for the following components:   Color, Urine YELLOW (*)    APPearance CLEAR (*)    All other components within normal limits     EKG     RADIOLOGY CT renal stone, ultrasound abdomen right upper quadrant    PROCEDURES:   Procedures  Critical Care: No Chief Complaint  Patient presents with   Abdominal Pain      MEDICATIONS ORDERED IN ED: Medications  fentaNYL  (SUBLIMAZE ) injection 50 mcg (50 mcg Intravenous Patient Refused/Not Given 03/11/24 2008)  ondansetron  (ZOFRAN ) injection  4 mg (4 mg Intravenous Given 03/11/24 1914)  sodium chloride  0.9 % bolus 1,000 mL (0 mLs Intravenous Stopped 03/11/24 2008)  pantoprazole  (PROTONIX ) injection 40 mg (40 mg Intravenous Given 03/11/24 1914)     IMPRESSION / MDM / ASSESSMENT AND PLAN / ED COURSE  I reviewed the triage vital signs and the nursing notes.                              Differential diagnosis includes, but is not limited to, pancreatitis, acute cholecystitis, cholelithiasis, bowel obstruction, acute appendicitis, kidney stone, pyelonephritis, esophagitis, GERD  Patient's presentation is most consistent with acute illness / injury with system symptoms.   Medications given: Zofran  4 mg IV, Protonix  40 mg IV  Patient does not appear to be dehydrated.  Will give some medication for comfort via IV.  CT stone study, independent review interpretation by me as being negative for acute abnormality  Ultrasound right upper quadrant independent review interpretation by me as being negative for any acute abnormality  Patient had some relief with  medication.  Will go ahead and treat him with Protonix  as outpatient.  Encouraged him to follow-up with his regular doctor and GI.  Return emergency department worsening.  Do not see any red flags to further workup.  All labs are reassuring.  He does not exhibit symptoms of an MI at this time.  So do not feel that any of the abdominal pain is cardiac related.  He is in agreement treatment plan.  Discharged stable condition.      FINAL CLINICAL IMPRESSION(S) / ED DIAGNOSES   Final diagnoses:  Pain of upper abdomen  Esophagitis     Rx / DC Orders   ED Discharge Orders          Ordered    pantoprazole  (PROTONIX ) 40 MG tablet  Daily        03/11/24 1959             Note:  This document was prepared using Dragon voice recognition software and may include unintentional dictation errors.    Gasper Devere ORN, PA-C 03/11/24 2116    Clarine Ozell LABOR, MD 03/13/24 0036  "
